# Patient Record
Sex: Male | Born: 1940 | Race: White | Hispanic: No | Marital: Married | Smoking: Former smoker
Health system: Southern US, Community
[De-identification: ages and names within clinical notes are randomized; demographics above are authoritative.]

## PROBLEM LIST (undated history)

## (undated) DIAGNOSIS — C4490 Unspecified malignant neoplasm of skin, unspecified: Secondary | ICD-10-CM

## (undated) DIAGNOSIS — W309XXA Contact with unspecified agricultural machinery, initial encounter: Secondary | ICD-10-CM

## (undated) DIAGNOSIS — J449 Chronic obstructive pulmonary disease, unspecified: Secondary | ICD-10-CM

## (undated) DIAGNOSIS — I350 Nonrheumatic aortic (valve) stenosis: Secondary | ICD-10-CM

## (undated) DIAGNOSIS — E7800 Pure hypercholesterolemia, unspecified: Secondary | ICD-10-CM

## (undated) DIAGNOSIS — M1990 Unspecified osteoarthritis, unspecified site: Secondary | ICD-10-CM

## (undated) DIAGNOSIS — E119 Type 2 diabetes mellitus without complications: Secondary | ICD-10-CM

## (undated) DIAGNOSIS — I4891 Unspecified atrial fibrillation: Secondary | ICD-10-CM

## (undated) DIAGNOSIS — E1151 Type 2 diabetes mellitus with diabetic peripheral angiopathy without gangrene: Secondary | ICD-10-CM

## (undated) DIAGNOSIS — I10 Essential (primary) hypertension: Secondary | ICD-10-CM

## (undated) DIAGNOSIS — Z8739 Personal history of other diseases of the musculoskeletal system and connective tissue: Secondary | ICD-10-CM

## (undated) DIAGNOSIS — G510 Bell's palsy: Secondary | ICD-10-CM

## (undated) DIAGNOSIS — I700 Atherosclerosis of aorta: Secondary | ICD-10-CM

## (undated) HISTORY — PX: SKIN CANCER EXCISION: SHX779

## (undated) HISTORY — PX: CARDIOVERSION: SHX1299

## (undated) HISTORY — PX: CATARACT EXTRACTION, BILATERAL: SHX1313

## (undated) HISTORY — DX: Type 2 diabetes mellitus with diabetic peripheral angiopathy without gangrene: E11.51

## (undated) HISTORY — DX: Chronic obstructive pulmonary disease, unspecified: J44.9

## (undated) HISTORY — PX: LAPAROSCOPIC CHOLECYSTECTOMY: SUR755

## (undated) HISTORY — DX: Atherosclerosis of aorta: I70.0

## (undated) HISTORY — DX: Nonrheumatic aortic (valve) stenosis: I35.0

---

## 2004-10-22 DIAGNOSIS — W309XXA Contact with unspecified agricultural machinery, initial encounter: Secondary | ICD-10-CM | POA: Insufficient documentation

## 2004-10-22 HISTORY — DX: Contact with unspecified agricultural machinery, initial encounter: W30.9XXA

## 2011-06-12 DIAGNOSIS — L97509 Non-pressure chronic ulcer of other part of unspecified foot with unspecified severity: Secondary | ICD-10-CM | POA: Diagnosis not present

## 2011-06-12 DIAGNOSIS — I10 Essential (primary) hypertension: Secondary | ICD-10-CM | POA: Diagnosis not present

## 2011-06-12 DIAGNOSIS — B070 Plantar wart: Secondary | ICD-10-CM | POA: Diagnosis not present

## 2011-06-17 DIAGNOSIS — Z79899 Other long term (current) drug therapy: Secondary | ICD-10-CM | POA: Diagnosis not present

## 2011-06-17 DIAGNOSIS — I4891 Unspecified atrial fibrillation: Secondary | ICD-10-CM | POA: Diagnosis not present

## 2011-06-18 DIAGNOSIS — H2510 Age-related nuclear cataract, unspecified eye: Secondary | ICD-10-CM | POA: Diagnosis not present

## 2011-06-25 DIAGNOSIS — E119 Type 2 diabetes mellitus without complications: Secondary | ICD-10-CM | POA: Diagnosis not present

## 2011-06-25 DIAGNOSIS — E785 Hyperlipidemia, unspecified: Secondary | ICD-10-CM | POA: Diagnosis not present

## 2011-06-25 DIAGNOSIS — I10 Essential (primary) hypertension: Secondary | ICD-10-CM | POA: Diagnosis not present

## 2011-06-25 DIAGNOSIS — I4891 Unspecified atrial fibrillation: Secondary | ICD-10-CM | POA: Diagnosis not present

## 2011-06-30 DIAGNOSIS — H2510 Age-related nuclear cataract, unspecified eye: Secondary | ICD-10-CM | POA: Diagnosis not present

## 2011-07-03 DIAGNOSIS — H2510 Age-related nuclear cataract, unspecified eye: Secondary | ICD-10-CM | POA: Diagnosis not present

## 2011-07-03 DIAGNOSIS — IMO0002 Reserved for concepts with insufficient information to code with codable children: Secondary | ICD-10-CM | POA: Diagnosis not present

## 2011-07-10 DIAGNOSIS — I10 Essential (primary) hypertension: Secondary | ICD-10-CM | POA: Diagnosis not present

## 2011-07-10 DIAGNOSIS — B070 Plantar wart: Secondary | ICD-10-CM | POA: Diagnosis not present

## 2011-07-10 DIAGNOSIS — L97509 Non-pressure chronic ulcer of other part of unspecified foot with unspecified severity: Secondary | ICD-10-CM | POA: Diagnosis not present

## 2011-07-22 DIAGNOSIS — I4891 Unspecified atrial fibrillation: Secondary | ICD-10-CM | POA: Diagnosis not present

## 2011-07-22 DIAGNOSIS — Z7901 Long term (current) use of anticoagulants: Secondary | ICD-10-CM | POA: Diagnosis not present

## 2011-07-24 DIAGNOSIS — B070 Plantar wart: Secondary | ICD-10-CM | POA: Diagnosis not present

## 2011-07-24 DIAGNOSIS — L97509 Non-pressure chronic ulcer of other part of unspecified foot with unspecified severity: Secondary | ICD-10-CM | POA: Diagnosis not present

## 2011-08-07 DIAGNOSIS — B070 Plantar wart: Secondary | ICD-10-CM | POA: Diagnosis not present

## 2011-08-07 DIAGNOSIS — L97509 Non-pressure chronic ulcer of other part of unspecified foot with unspecified severity: Secondary | ICD-10-CM | POA: Diagnosis not present

## 2011-08-07 DIAGNOSIS — I10 Essential (primary) hypertension: Secondary | ICD-10-CM | POA: Diagnosis not present

## 2011-08-18 DIAGNOSIS — Z0000 Encounter for general adult medical examination without abnormal findings: Secondary | ICD-10-CM | POA: Diagnosis not present

## 2011-08-19 DIAGNOSIS — Z7901 Long term (current) use of anticoagulants: Secondary | ICD-10-CM | POA: Diagnosis not present

## 2011-08-19 DIAGNOSIS — I4891 Unspecified atrial fibrillation: Secondary | ICD-10-CM | POA: Diagnosis not present

## 2011-08-21 DIAGNOSIS — B070 Plantar wart: Secondary | ICD-10-CM | POA: Diagnosis not present

## 2011-08-21 DIAGNOSIS — L97509 Non-pressure chronic ulcer of other part of unspecified foot with unspecified severity: Secondary | ICD-10-CM | POA: Diagnosis not present

## 2011-09-01 DIAGNOSIS — R5381 Other malaise: Secondary | ICD-10-CM | POA: Diagnosis not present

## 2011-09-01 DIAGNOSIS — E669 Obesity, unspecified: Secondary | ICD-10-CM | POA: Diagnosis not present

## 2011-09-04 DIAGNOSIS — L97509 Non-pressure chronic ulcer of other part of unspecified foot with unspecified severity: Secondary | ICD-10-CM | POA: Diagnosis not present

## 2011-09-04 DIAGNOSIS — B070 Plantar wart: Secondary | ICD-10-CM | POA: Diagnosis not present

## 2011-09-18 DIAGNOSIS — B070 Plantar wart: Secondary | ICD-10-CM | POA: Diagnosis not present

## 2011-09-18 DIAGNOSIS — L97509 Non-pressure chronic ulcer of other part of unspecified foot with unspecified severity: Secondary | ICD-10-CM | POA: Diagnosis not present

## 2011-09-18 DIAGNOSIS — I10 Essential (primary) hypertension: Secondary | ICD-10-CM | POA: Diagnosis not present

## 2011-09-30 DIAGNOSIS — E782 Mixed hyperlipidemia: Secondary | ICD-10-CM | POA: Diagnosis not present

## 2011-09-30 DIAGNOSIS — E1129 Type 2 diabetes mellitus with other diabetic kidney complication: Secondary | ICD-10-CM | POA: Diagnosis not present

## 2011-09-30 DIAGNOSIS — I10 Essential (primary) hypertension: Secondary | ICD-10-CM | POA: Diagnosis not present

## 2011-09-30 DIAGNOSIS — I4891 Unspecified atrial fibrillation: Secondary | ICD-10-CM | POA: Diagnosis not present

## 2011-10-02 DIAGNOSIS — M79609 Pain in unspecified limb: Secondary | ICD-10-CM | POA: Diagnosis not present

## 2011-10-16 DIAGNOSIS — L97509 Non-pressure chronic ulcer of other part of unspecified foot with unspecified severity: Secondary | ICD-10-CM | POA: Diagnosis not present

## 2011-10-30 DIAGNOSIS — S91109A Unspecified open wound of unspecified toe(s) without damage to nail, initial encounter: Secondary | ICD-10-CM | POA: Diagnosis not present

## 2011-10-30 DIAGNOSIS — I4891 Unspecified atrial fibrillation: Secondary | ICD-10-CM | POA: Diagnosis not present

## 2011-12-01 DIAGNOSIS — I4891 Unspecified atrial fibrillation: Secondary | ICD-10-CM | POA: Diagnosis not present

## 2011-12-30 DIAGNOSIS — I4891 Unspecified atrial fibrillation: Secondary | ICD-10-CM | POA: Diagnosis not present

## 2012-01-29 DIAGNOSIS — I4891 Unspecified atrial fibrillation: Secondary | ICD-10-CM | POA: Diagnosis not present

## 2012-02-05 DIAGNOSIS — E782 Mixed hyperlipidemia: Secondary | ICD-10-CM | POA: Diagnosis not present

## 2012-02-05 DIAGNOSIS — E1129 Type 2 diabetes mellitus with other diabetic kidney complication: Secondary | ICD-10-CM | POA: Diagnosis not present

## 2012-02-05 DIAGNOSIS — I10 Essential (primary) hypertension: Secondary | ICD-10-CM | POA: Diagnosis not present

## 2012-02-05 DIAGNOSIS — E1165 Type 2 diabetes mellitus with hyperglycemia: Secondary | ICD-10-CM | POA: Diagnosis not present

## 2012-02-19 DIAGNOSIS — I10 Essential (primary) hypertension: Secondary | ICD-10-CM | POA: Diagnosis not present

## 2012-02-19 DIAGNOSIS — E785 Hyperlipidemia, unspecified: Secondary | ICD-10-CM | POA: Diagnosis not present

## 2012-02-19 DIAGNOSIS — I4891 Unspecified atrial fibrillation: Secondary | ICD-10-CM | POA: Diagnosis not present

## 2012-02-19 DIAGNOSIS — E119 Type 2 diabetes mellitus without complications: Secondary | ICD-10-CM | POA: Diagnosis not present

## 2012-02-23 DIAGNOSIS — I4891 Unspecified atrial fibrillation: Secondary | ICD-10-CM | POA: Diagnosis not present

## 2012-03-04 DIAGNOSIS — I4891 Unspecified atrial fibrillation: Secondary | ICD-10-CM | POA: Diagnosis not present

## 2012-03-04 DIAGNOSIS — E782 Mixed hyperlipidemia: Secondary | ICD-10-CM | POA: Diagnosis not present

## 2012-04-02 DIAGNOSIS — Z7901 Long term (current) use of anticoagulants: Secondary | ICD-10-CM | POA: Diagnosis not present

## 2012-04-02 DIAGNOSIS — I4891 Unspecified atrial fibrillation: Secondary | ICD-10-CM | POA: Diagnosis not present

## 2012-04-28 DIAGNOSIS — I4891 Unspecified atrial fibrillation: Secondary | ICD-10-CM | POA: Diagnosis not present

## 2012-04-28 DIAGNOSIS — Z7901 Long term (current) use of anticoagulants: Secondary | ICD-10-CM | POA: Diagnosis not present

## 2012-06-11 DIAGNOSIS — E782 Mixed hyperlipidemia: Secondary | ICD-10-CM | POA: Diagnosis not present

## 2012-06-11 DIAGNOSIS — E1165 Type 2 diabetes mellitus with hyperglycemia: Secondary | ICD-10-CM | POA: Diagnosis not present

## 2012-06-11 DIAGNOSIS — E1129 Type 2 diabetes mellitus with other diabetic kidney complication: Secondary | ICD-10-CM | POA: Diagnosis not present

## 2012-06-11 DIAGNOSIS — Z7901 Long term (current) use of anticoagulants: Secondary | ICD-10-CM | POA: Diagnosis not present

## 2012-06-11 DIAGNOSIS — I10 Essential (primary) hypertension: Secondary | ICD-10-CM | POA: Diagnosis not present

## 2012-06-11 DIAGNOSIS — I4891 Unspecified atrial fibrillation: Secondary | ICD-10-CM | POA: Diagnosis not present

## 2012-07-29 DIAGNOSIS — Z7901 Long term (current) use of anticoagulants: Secondary | ICD-10-CM | POA: Diagnosis not present

## 2012-07-29 DIAGNOSIS — I4891 Unspecified atrial fibrillation: Secondary | ICD-10-CM | POA: Diagnosis not present

## 2012-08-23 DIAGNOSIS — E119 Type 2 diabetes mellitus without complications: Secondary | ICD-10-CM | POA: Diagnosis not present

## 2012-08-23 DIAGNOSIS — I10 Essential (primary) hypertension: Secondary | ICD-10-CM | POA: Diagnosis not present

## 2012-08-23 DIAGNOSIS — E669 Obesity, unspecified: Secondary | ICD-10-CM | POA: Diagnosis not present

## 2012-08-23 DIAGNOSIS — E785 Hyperlipidemia, unspecified: Secondary | ICD-10-CM | POA: Diagnosis not present

## 2012-08-23 DIAGNOSIS — I4891 Unspecified atrial fibrillation: Secondary | ICD-10-CM | POA: Diagnosis not present

## 2012-09-06 DIAGNOSIS — K529 Noninfective gastroenteritis and colitis, unspecified: Secondary | ICD-10-CM | POA: Diagnosis not present

## 2012-09-06 DIAGNOSIS — I4891 Unspecified atrial fibrillation: Secondary | ICD-10-CM | POA: Diagnosis not present

## 2012-10-12 DIAGNOSIS — Z1331 Encounter for screening for depression: Secondary | ICD-10-CM | POA: Diagnosis not present

## 2012-10-12 DIAGNOSIS — I4891 Unspecified atrial fibrillation: Secondary | ICD-10-CM | POA: Diagnosis not present

## 2012-10-14 DIAGNOSIS — I10 Essential (primary) hypertension: Secondary | ICD-10-CM | POA: Diagnosis not present

## 2012-10-14 DIAGNOSIS — E1165 Type 2 diabetes mellitus with hyperglycemia: Secondary | ICD-10-CM | POA: Diagnosis not present

## 2012-10-14 DIAGNOSIS — E782 Mixed hyperlipidemia: Secondary | ICD-10-CM | POA: Diagnosis not present

## 2012-10-14 DIAGNOSIS — E1129 Type 2 diabetes mellitus with other diabetic kidney complication: Secondary | ICD-10-CM | POA: Diagnosis not present

## 2012-11-11 DIAGNOSIS — I4891 Unspecified atrial fibrillation: Secondary | ICD-10-CM | POA: Diagnosis not present

## 2012-12-13 DIAGNOSIS — I4891 Unspecified atrial fibrillation: Secondary | ICD-10-CM | POA: Diagnosis not present

## 2013-01-13 DIAGNOSIS — I4891 Unspecified atrial fibrillation: Secondary | ICD-10-CM | POA: Diagnosis not present

## 2013-02-23 DIAGNOSIS — E1129 Type 2 diabetes mellitus with other diabetic kidney complication: Secondary | ICD-10-CM | POA: Diagnosis not present

## 2013-02-23 DIAGNOSIS — E782 Mixed hyperlipidemia: Secondary | ICD-10-CM | POA: Diagnosis not present

## 2013-02-23 DIAGNOSIS — N058 Unspecified nephritic syndrome with other morphologic changes: Secondary | ICD-10-CM | POA: Diagnosis not present

## 2013-02-23 DIAGNOSIS — I4891 Unspecified atrial fibrillation: Secondary | ICD-10-CM | POA: Diagnosis not present

## 2013-02-23 DIAGNOSIS — I10 Essential (primary) hypertension: Secondary | ICD-10-CM | POA: Diagnosis not present

## 2013-03-24 DIAGNOSIS — I4891 Unspecified atrial fibrillation: Secondary | ICD-10-CM | POA: Diagnosis not present

## 2013-04-19 DIAGNOSIS — I4891 Unspecified atrial fibrillation: Secondary | ICD-10-CM | POA: Diagnosis not present

## 2013-05-26 DIAGNOSIS — I4891 Unspecified atrial fibrillation: Secondary | ICD-10-CM | POA: Diagnosis not present

## 2013-07-13 DIAGNOSIS — Z1389 Encounter for screening for other disorder: Secondary | ICD-10-CM | POA: Diagnosis not present

## 2013-07-13 DIAGNOSIS — I10 Essential (primary) hypertension: Secondary | ICD-10-CM | POA: Diagnosis not present

## 2013-07-13 DIAGNOSIS — E1129 Type 2 diabetes mellitus with other diabetic kidney complication: Secondary | ICD-10-CM | POA: Diagnosis not present

## 2013-07-13 DIAGNOSIS — E782 Mixed hyperlipidemia: Secondary | ICD-10-CM | POA: Diagnosis not present

## 2013-08-11 DIAGNOSIS — I4891 Unspecified atrial fibrillation: Secondary | ICD-10-CM | POA: Diagnosis not present

## 2013-08-11 DIAGNOSIS — Z7901 Long term (current) use of anticoagulants: Secondary | ICD-10-CM | POA: Diagnosis not present

## 2013-09-12 DIAGNOSIS — I4891 Unspecified atrial fibrillation: Secondary | ICD-10-CM | POA: Diagnosis not present

## 2013-09-12 DIAGNOSIS — Z7901 Long term (current) use of anticoagulants: Secondary | ICD-10-CM | POA: Diagnosis not present

## 2013-10-10 DIAGNOSIS — Z7901 Long term (current) use of anticoagulants: Secondary | ICD-10-CM | POA: Diagnosis not present

## 2013-10-10 DIAGNOSIS — I4891 Unspecified atrial fibrillation: Secondary | ICD-10-CM | POA: Diagnosis not present

## 2013-11-04 DIAGNOSIS — I10 Essential (primary) hypertension: Secondary | ICD-10-CM | POA: Diagnosis not present

## 2013-11-04 DIAGNOSIS — E1129 Type 2 diabetes mellitus with other diabetic kidney complication: Secondary | ICD-10-CM | POA: Diagnosis not present

## 2013-11-04 DIAGNOSIS — E782 Mixed hyperlipidemia: Secondary | ICD-10-CM | POA: Diagnosis not present

## 2013-11-10 DIAGNOSIS — E1149 Type 2 diabetes mellitus with other diabetic neurological complication: Secondary | ICD-10-CM | POA: Diagnosis not present

## 2013-11-10 DIAGNOSIS — I4891 Unspecified atrial fibrillation: Secondary | ICD-10-CM | POA: Diagnosis not present

## 2013-11-10 DIAGNOSIS — E782 Mixed hyperlipidemia: Secondary | ICD-10-CM | POA: Diagnosis not present

## 2013-11-10 DIAGNOSIS — Z139 Encounter for screening, unspecified: Secondary | ICD-10-CM | POA: Diagnosis not present

## 2013-11-10 DIAGNOSIS — I10 Essential (primary) hypertension: Secondary | ICD-10-CM | POA: Diagnosis not present

## 2013-11-10 DIAGNOSIS — E1142 Type 2 diabetes mellitus with diabetic polyneuropathy: Secondary | ICD-10-CM | POA: Diagnosis not present

## 2013-11-10 DIAGNOSIS — Z7901 Long term (current) use of anticoagulants: Secondary | ICD-10-CM | POA: Diagnosis not present

## 2013-12-13 DIAGNOSIS — I4891 Unspecified atrial fibrillation: Secondary | ICD-10-CM | POA: Diagnosis not present

## 2014-01-16 DIAGNOSIS — M5030 Other cervical disc degeneration, unspecified cervical region: Secondary | ICD-10-CM | POA: Diagnosis not present

## 2014-01-16 DIAGNOSIS — M542 Cervicalgia: Secondary | ICD-10-CM | POA: Diagnosis not present

## 2014-01-16 DIAGNOSIS — M47812 Spondylosis without myelopathy or radiculopathy, cervical region: Secondary | ICD-10-CM | POA: Diagnosis not present

## 2014-01-16 DIAGNOSIS — I4891 Unspecified atrial fibrillation: Secondary | ICD-10-CM | POA: Diagnosis not present

## 2014-01-16 DIAGNOSIS — Z6836 Body mass index (BMI) 36.0-36.9, adult: Secondary | ICD-10-CM | POA: Diagnosis not present

## 2014-01-18 DIAGNOSIS — I4891 Unspecified atrial fibrillation: Secondary | ICD-10-CM | POA: Diagnosis not present

## 2014-01-18 DIAGNOSIS — Z7901 Long term (current) use of anticoagulants: Secondary | ICD-10-CM | POA: Diagnosis not present

## 2014-01-18 DIAGNOSIS — E785 Hyperlipidemia, unspecified: Secondary | ICD-10-CM | POA: Diagnosis not present

## 2014-01-18 DIAGNOSIS — I059 Rheumatic mitral valve disease, unspecified: Secondary | ICD-10-CM | POA: Diagnosis not present

## 2014-01-18 DIAGNOSIS — Z79899 Other long term (current) drug therapy: Secondary | ICD-10-CM | POA: Diagnosis not present

## 2014-01-18 DIAGNOSIS — I10 Essential (primary) hypertension: Secondary | ICD-10-CM | POA: Diagnosis not present

## 2014-01-18 DIAGNOSIS — E119 Type 2 diabetes mellitus without complications: Secondary | ICD-10-CM | POA: Diagnosis not present

## 2014-01-23 DIAGNOSIS — I059 Rheumatic mitral valve disease, unspecified: Secondary | ICD-10-CM | POA: Diagnosis not present

## 2014-01-23 DIAGNOSIS — I4891 Unspecified atrial fibrillation: Secondary | ICD-10-CM | POA: Diagnosis not present

## 2014-01-23 DIAGNOSIS — I119 Hypertensive heart disease without heart failure: Secondary | ICD-10-CM | POA: Diagnosis not present

## 2014-01-24 DIAGNOSIS — I4891 Unspecified atrial fibrillation: Secondary | ICD-10-CM | POA: Diagnosis not present

## 2014-01-30 DIAGNOSIS — Z6835 Body mass index (BMI) 35.0-35.9, adult: Secondary | ICD-10-CM | POA: Diagnosis not present

## 2014-01-30 DIAGNOSIS — M542 Cervicalgia: Secondary | ICD-10-CM | POA: Diagnosis not present

## 2014-02-15 DIAGNOSIS — I10 Essential (primary) hypertension: Secondary | ICD-10-CM | POA: Diagnosis not present

## 2014-02-15 DIAGNOSIS — I059 Rheumatic mitral valve disease, unspecified: Secondary | ICD-10-CM | POA: Diagnosis not present

## 2014-02-15 DIAGNOSIS — I4891 Unspecified atrial fibrillation: Secondary | ICD-10-CM | POA: Diagnosis not present

## 2014-02-15 DIAGNOSIS — Z79899 Other long term (current) drug therapy: Secondary | ICD-10-CM | POA: Diagnosis not present

## 2014-02-15 DIAGNOSIS — E119 Type 2 diabetes mellitus without complications: Secondary | ICD-10-CM | POA: Diagnosis not present

## 2014-02-15 DIAGNOSIS — E785 Hyperlipidemia, unspecified: Secondary | ICD-10-CM | POA: Diagnosis not present

## 2014-02-16 DIAGNOSIS — I4891 Unspecified atrial fibrillation: Secondary | ICD-10-CM | POA: Diagnosis not present

## 2014-03-14 DIAGNOSIS — E782 Mixed hyperlipidemia: Secondary | ICD-10-CM | POA: Diagnosis not present

## 2014-03-14 DIAGNOSIS — I10 Essential (primary) hypertension: Secondary | ICD-10-CM | POA: Diagnosis not present

## 2014-03-14 DIAGNOSIS — E1140 Type 2 diabetes mellitus with diabetic neuropathy, unspecified: Secondary | ICD-10-CM | POA: Diagnosis not present

## 2014-03-16 DIAGNOSIS — E1140 Type 2 diabetes mellitus with diabetic neuropathy, unspecified: Secondary | ICD-10-CM | POA: Diagnosis not present

## 2014-03-16 DIAGNOSIS — Z1389 Encounter for screening for other disorder: Secondary | ICD-10-CM | POA: Diagnosis not present

## 2014-03-16 DIAGNOSIS — I482 Chronic atrial fibrillation: Secondary | ICD-10-CM | POA: Diagnosis not present

## 2014-03-16 DIAGNOSIS — Z0000 Encounter for general adult medical examination without abnormal findings: Secondary | ICD-10-CM | POA: Diagnosis not present

## 2014-03-16 DIAGNOSIS — Z139 Encounter for screening, unspecified: Secondary | ICD-10-CM | POA: Diagnosis not present

## 2014-03-30 DIAGNOSIS — I482 Chronic atrial fibrillation: Secondary | ICD-10-CM | POA: Diagnosis not present

## 2014-05-01 DIAGNOSIS — I482 Chronic atrial fibrillation: Secondary | ICD-10-CM | POA: Diagnosis not present

## 2014-05-31 DIAGNOSIS — I482 Chronic atrial fibrillation: Secondary | ICD-10-CM | POA: Diagnosis not present

## 2014-07-04 DIAGNOSIS — E1140 Type 2 diabetes mellitus with diabetic neuropathy, unspecified: Secondary | ICD-10-CM | POA: Diagnosis not present

## 2014-07-04 DIAGNOSIS — E782 Mixed hyperlipidemia: Secondary | ICD-10-CM | POA: Diagnosis not present

## 2014-07-10 DIAGNOSIS — I10 Essential (primary) hypertension: Secondary | ICD-10-CM | POA: Diagnosis not present

## 2014-07-10 DIAGNOSIS — E782 Mixed hyperlipidemia: Secondary | ICD-10-CM | POA: Diagnosis not present

## 2014-07-10 DIAGNOSIS — I4891 Unspecified atrial fibrillation: Secondary | ICD-10-CM | POA: Diagnosis not present

## 2014-07-10 DIAGNOSIS — E1140 Type 2 diabetes mellitus with diabetic neuropathy, unspecified: Secondary | ICD-10-CM | POA: Diagnosis not present

## 2014-08-08 DIAGNOSIS — I4891 Unspecified atrial fibrillation: Secondary | ICD-10-CM | POA: Diagnosis not present

## 2014-09-12 DIAGNOSIS — I482 Chronic atrial fibrillation: Secondary | ICD-10-CM | POA: Diagnosis not present

## 2014-10-11 DIAGNOSIS — I482 Chronic atrial fibrillation: Secondary | ICD-10-CM | POA: Diagnosis not present

## 2014-11-07 DIAGNOSIS — E782 Mixed hyperlipidemia: Secondary | ICD-10-CM | POA: Diagnosis not present

## 2014-11-07 DIAGNOSIS — I10 Essential (primary) hypertension: Secondary | ICD-10-CM | POA: Diagnosis not present

## 2014-11-07 DIAGNOSIS — E1140 Type 2 diabetes mellitus with diabetic neuropathy, unspecified: Secondary | ICD-10-CM | POA: Diagnosis not present

## 2014-11-13 DIAGNOSIS — E1140 Type 2 diabetes mellitus with diabetic neuropathy, unspecified: Secondary | ICD-10-CM | POA: Diagnosis not present

## 2014-11-13 DIAGNOSIS — I10 Essential (primary) hypertension: Secondary | ICD-10-CM | POA: Diagnosis not present

## 2014-11-13 DIAGNOSIS — E1159 Type 2 diabetes mellitus with other circulatory complications: Secondary | ICD-10-CM | POA: Diagnosis not present

## 2014-11-13 DIAGNOSIS — I482 Chronic atrial fibrillation: Secondary | ICD-10-CM | POA: Diagnosis not present

## 2014-12-18 DIAGNOSIS — I482 Chronic atrial fibrillation: Secondary | ICD-10-CM | POA: Diagnosis not present

## 2015-01-18 DIAGNOSIS — I482 Chronic atrial fibrillation: Secondary | ICD-10-CM | POA: Diagnosis not present

## 2015-02-19 DIAGNOSIS — I482 Chronic atrial fibrillation: Secondary | ICD-10-CM | POA: Diagnosis not present

## 2015-03-08 DIAGNOSIS — E1140 Type 2 diabetes mellitus with diabetic neuropathy, unspecified: Secondary | ICD-10-CM | POA: Diagnosis not present

## 2015-03-08 DIAGNOSIS — E1159 Type 2 diabetes mellitus with other circulatory complications: Secondary | ICD-10-CM | POA: Diagnosis not present

## 2015-03-08 DIAGNOSIS — E782 Mixed hyperlipidemia: Secondary | ICD-10-CM | POA: Diagnosis not present

## 2015-03-12 DIAGNOSIS — R809 Proteinuria, unspecified: Secondary | ICD-10-CM | POA: Diagnosis not present

## 2015-03-12 DIAGNOSIS — E1129 Type 2 diabetes mellitus with other diabetic kidney complication: Secondary | ICD-10-CM | POA: Diagnosis not present

## 2015-03-12 DIAGNOSIS — E1159 Type 2 diabetes mellitus with other circulatory complications: Secondary | ICD-10-CM | POA: Diagnosis not present

## 2015-03-12 DIAGNOSIS — E782 Mixed hyperlipidemia: Secondary | ICD-10-CM | POA: Diagnosis not present

## 2015-03-12 DIAGNOSIS — E1140 Type 2 diabetes mellitus with diabetic neuropathy, unspecified: Secondary | ICD-10-CM | POA: Diagnosis not present

## 2015-03-12 DIAGNOSIS — I10 Essential (primary) hypertension: Secondary | ICD-10-CM | POA: Diagnosis not present

## 2015-04-03 DIAGNOSIS — Z1389 Encounter for screening for other disorder: Secondary | ICD-10-CM | POA: Diagnosis not present

## 2015-04-03 DIAGNOSIS — Z139 Encounter for screening, unspecified: Secondary | ICD-10-CM | POA: Diagnosis not present

## 2015-04-03 DIAGNOSIS — I482 Chronic atrial fibrillation: Secondary | ICD-10-CM | POA: Diagnosis not present

## 2015-04-03 DIAGNOSIS — Z0000 Encounter for general adult medical examination without abnormal findings: Secondary | ICD-10-CM | POA: Diagnosis not present

## 2015-04-18 DIAGNOSIS — I482 Chronic atrial fibrillation: Secondary | ICD-10-CM | POA: Diagnosis not present

## 2015-05-17 DIAGNOSIS — I482 Chronic atrial fibrillation: Secondary | ICD-10-CM | POA: Diagnosis not present

## 2015-06-19 DIAGNOSIS — I482 Chronic atrial fibrillation: Secondary | ICD-10-CM | POA: Diagnosis not present

## 2015-07-10 DIAGNOSIS — E782 Mixed hyperlipidemia: Secondary | ICD-10-CM | POA: Diagnosis not present

## 2015-07-10 DIAGNOSIS — E1159 Type 2 diabetes mellitus with other circulatory complications: Secondary | ICD-10-CM | POA: Diagnosis not present

## 2015-07-10 DIAGNOSIS — E1140 Type 2 diabetes mellitus with diabetic neuropathy, unspecified: Secondary | ICD-10-CM | POA: Diagnosis not present

## 2015-07-10 DIAGNOSIS — I482 Chronic atrial fibrillation: Secondary | ICD-10-CM | POA: Diagnosis not present

## 2015-07-10 DIAGNOSIS — Z125 Encounter for screening for malignant neoplasm of prostate: Secondary | ICD-10-CM | POA: Diagnosis not present

## 2015-07-16 DIAGNOSIS — I10 Essential (primary) hypertension: Secondary | ICD-10-CM | POA: Diagnosis not present

## 2015-07-16 DIAGNOSIS — E782 Mixed hyperlipidemia: Secondary | ICD-10-CM | POA: Diagnosis not present

## 2015-07-16 DIAGNOSIS — E1159 Type 2 diabetes mellitus with other circulatory complications: Secondary | ICD-10-CM | POA: Diagnosis not present

## 2015-07-16 DIAGNOSIS — E1140 Type 2 diabetes mellitus with diabetic neuropathy, unspecified: Secondary | ICD-10-CM | POA: Diagnosis not present

## 2015-08-07 DIAGNOSIS — I482 Chronic atrial fibrillation: Secondary | ICD-10-CM | POA: Diagnosis not present

## 2015-08-21 DIAGNOSIS — I482 Chronic atrial fibrillation: Secondary | ICD-10-CM | POA: Diagnosis not present

## 2015-09-19 DIAGNOSIS — I482 Chronic atrial fibrillation: Secondary | ICD-10-CM | POA: Diagnosis not present

## 2015-11-07 DIAGNOSIS — E1159 Type 2 diabetes mellitus with other circulatory complications: Secondary | ICD-10-CM | POA: Diagnosis not present

## 2015-11-07 DIAGNOSIS — E1140 Type 2 diabetes mellitus with diabetic neuropathy, unspecified: Secondary | ICD-10-CM | POA: Diagnosis not present

## 2015-11-07 DIAGNOSIS — E782 Mixed hyperlipidemia: Secondary | ICD-10-CM | POA: Diagnosis not present

## 2015-11-13 DIAGNOSIS — E1140 Type 2 diabetes mellitus with diabetic neuropathy, unspecified: Secondary | ICD-10-CM | POA: Diagnosis not present

## 2015-11-13 DIAGNOSIS — I10 Essential (primary) hypertension: Secondary | ICD-10-CM | POA: Diagnosis not present

## 2015-11-13 DIAGNOSIS — E782 Mixed hyperlipidemia: Secondary | ICD-10-CM | POA: Diagnosis not present

## 2015-11-13 DIAGNOSIS — E1159 Type 2 diabetes mellitus with other circulatory complications: Secondary | ICD-10-CM | POA: Diagnosis not present

## 2015-11-27 DIAGNOSIS — Z6830 Body mass index (BMI) 30.0-30.9, adult: Secondary | ICD-10-CM | POA: Diagnosis not present

## 2015-11-27 DIAGNOSIS — I4891 Unspecified atrial fibrillation: Secondary | ICD-10-CM | POA: Diagnosis not present

## 2015-12-27 DIAGNOSIS — I4891 Unspecified atrial fibrillation: Secondary | ICD-10-CM | POA: Diagnosis not present

## 2015-12-27 DIAGNOSIS — Z6835 Body mass index (BMI) 35.0-35.9, adult: Secondary | ICD-10-CM | POA: Diagnosis not present

## 2016-01-14 DIAGNOSIS — E119 Type 2 diabetes mellitus without complications: Secondary | ICD-10-CM | POA: Diagnosis not present

## 2016-01-14 DIAGNOSIS — H25812 Combined forms of age-related cataract, left eye: Secondary | ICD-10-CM | POA: Diagnosis not present

## 2016-01-28 DIAGNOSIS — I4891 Unspecified atrial fibrillation: Secondary | ICD-10-CM | POA: Diagnosis not present

## 2016-01-28 DIAGNOSIS — Z6830 Body mass index (BMI) 30.0-30.9, adult: Secondary | ICD-10-CM | POA: Diagnosis not present

## 2016-02-18 DIAGNOSIS — I482 Chronic atrial fibrillation: Secondary | ICD-10-CM | POA: Diagnosis not present

## 2016-02-18 DIAGNOSIS — Z6835 Body mass index (BMI) 35.0-35.9, adult: Secondary | ICD-10-CM | POA: Diagnosis not present

## 2016-03-06 DIAGNOSIS — E1140 Type 2 diabetes mellitus with diabetic neuropathy, unspecified: Secondary | ICD-10-CM | POA: Diagnosis not present

## 2016-03-06 DIAGNOSIS — E1159 Type 2 diabetes mellitus with other circulatory complications: Secondary | ICD-10-CM | POA: Diagnosis not present

## 2016-03-06 DIAGNOSIS — E782 Mixed hyperlipidemia: Secondary | ICD-10-CM | POA: Diagnosis not present

## 2016-03-07 DIAGNOSIS — H26491 Other secondary cataract, right eye: Secondary | ICD-10-CM | POA: Diagnosis not present

## 2016-03-07 DIAGNOSIS — E119 Type 2 diabetes mellitus without complications: Secondary | ICD-10-CM | POA: Diagnosis not present

## 2016-03-07 DIAGNOSIS — H25812 Combined forms of age-related cataract, left eye: Secondary | ICD-10-CM | POA: Diagnosis not present

## 2016-03-11 DIAGNOSIS — I482 Chronic atrial fibrillation: Secondary | ICD-10-CM | POA: Diagnosis not present

## 2016-03-11 DIAGNOSIS — E1169 Type 2 diabetes mellitus with other specified complication: Secondary | ICD-10-CM | POA: Diagnosis not present

## 2016-03-11 DIAGNOSIS — E1140 Type 2 diabetes mellitus with diabetic neuropathy, unspecified: Secondary | ICD-10-CM | POA: Diagnosis not present

## 2016-03-11 DIAGNOSIS — I10 Essential (primary) hypertension: Secondary | ICD-10-CM | POA: Diagnosis not present

## 2016-03-26 DIAGNOSIS — M1990 Unspecified osteoarthritis, unspecified site: Secondary | ICD-10-CM | POA: Diagnosis not present

## 2016-03-26 DIAGNOSIS — E119 Type 2 diabetes mellitus without complications: Secondary | ICD-10-CM | POA: Diagnosis not present

## 2016-03-26 DIAGNOSIS — H25812 Combined forms of age-related cataract, left eye: Secondary | ICD-10-CM | POA: Diagnosis not present

## 2016-03-26 DIAGNOSIS — Z85828 Personal history of other malignant neoplasm of skin: Secondary | ICD-10-CM | POA: Diagnosis not present

## 2016-03-26 DIAGNOSIS — E785 Hyperlipidemia, unspecified: Secondary | ICD-10-CM | POA: Diagnosis not present

## 2016-03-26 DIAGNOSIS — Z7984 Long term (current) use of oral hypoglycemic drugs: Secondary | ICD-10-CM | POA: Diagnosis not present

## 2016-03-26 DIAGNOSIS — I10 Essential (primary) hypertension: Secondary | ICD-10-CM | POA: Diagnosis not present

## 2016-03-26 DIAGNOSIS — Z7901 Long term (current) use of anticoagulants: Secondary | ICD-10-CM | POA: Diagnosis not present

## 2016-03-26 DIAGNOSIS — M109 Gout, unspecified: Secondary | ICD-10-CM | POA: Diagnosis not present

## 2016-03-26 DIAGNOSIS — Z79899 Other long term (current) drug therapy: Secondary | ICD-10-CM | POA: Diagnosis not present

## 2016-03-26 DIAGNOSIS — I4891 Unspecified atrial fibrillation: Secondary | ICD-10-CM | POA: Diagnosis not present

## 2016-04-07 DIAGNOSIS — H25812 Combined forms of age-related cataract, left eye: Secondary | ICD-10-CM | POA: Diagnosis not present

## 2016-04-16 DIAGNOSIS — Z0000 Encounter for general adult medical examination without abnormal findings: Secondary | ICD-10-CM | POA: Diagnosis not present

## 2016-04-16 DIAGNOSIS — Z139 Encounter for screening, unspecified: Secondary | ICD-10-CM | POA: Diagnosis not present

## 2016-04-16 DIAGNOSIS — E1140 Type 2 diabetes mellitus with diabetic neuropathy, unspecified: Secondary | ICD-10-CM | POA: Diagnosis not present

## 2016-04-16 DIAGNOSIS — I482 Chronic atrial fibrillation: Secondary | ICD-10-CM | POA: Diagnosis not present

## 2016-05-16 DIAGNOSIS — Z6831 Body mass index (BMI) 31.0-31.9, adult: Secondary | ICD-10-CM | POA: Diagnosis not present

## 2016-05-16 DIAGNOSIS — I482 Chronic atrial fibrillation: Secondary | ICD-10-CM | POA: Diagnosis not present

## 2016-06-16 DIAGNOSIS — Z6830 Body mass index (BMI) 30.0-30.9, adult: Secondary | ICD-10-CM | POA: Diagnosis not present

## 2016-06-16 DIAGNOSIS — I482 Chronic atrial fibrillation: Secondary | ICD-10-CM | POA: Diagnosis not present

## 2016-07-14 DIAGNOSIS — I482 Chronic atrial fibrillation: Secondary | ICD-10-CM | POA: Diagnosis not present

## 2016-07-18 DIAGNOSIS — E1169 Type 2 diabetes mellitus with other specified complication: Secondary | ICD-10-CM | POA: Diagnosis not present

## 2016-07-18 DIAGNOSIS — E1140 Type 2 diabetes mellitus with diabetic neuropathy, unspecified: Secondary | ICD-10-CM | POA: Diagnosis not present

## 2016-07-18 DIAGNOSIS — I10 Essential (primary) hypertension: Secondary | ICD-10-CM | POA: Diagnosis not present

## 2016-07-30 DIAGNOSIS — E1169 Type 2 diabetes mellitus with other specified complication: Secondary | ICD-10-CM | POA: Diagnosis not present

## 2016-07-30 DIAGNOSIS — I10 Essential (primary) hypertension: Secondary | ICD-10-CM | POA: Diagnosis not present

## 2016-07-30 DIAGNOSIS — E785 Hyperlipidemia, unspecified: Secondary | ICD-10-CM | POA: Diagnosis not present

## 2016-07-30 DIAGNOSIS — E1140 Type 2 diabetes mellitus with diabetic neuropathy, unspecified: Secondary | ICD-10-CM | POA: Diagnosis not present

## 2016-08-11 DIAGNOSIS — Z7901 Long term (current) use of anticoagulants: Secondary | ICD-10-CM | POA: Diagnosis not present

## 2016-08-11 DIAGNOSIS — I4891 Unspecified atrial fibrillation: Secondary | ICD-10-CM | POA: Diagnosis not present

## 2016-09-10 DIAGNOSIS — Z7901 Long term (current) use of anticoagulants: Secondary | ICD-10-CM | POA: Diagnosis not present

## 2016-09-10 DIAGNOSIS — Z6825 Body mass index (BMI) 25.0-25.9, adult: Secondary | ICD-10-CM | POA: Diagnosis not present

## 2016-09-10 DIAGNOSIS — I4891 Unspecified atrial fibrillation: Secondary | ICD-10-CM | POA: Diagnosis not present

## 2016-10-09 DIAGNOSIS — Z7901 Long term (current) use of anticoagulants: Secondary | ICD-10-CM | POA: Diagnosis not present

## 2016-10-09 DIAGNOSIS — I4891 Unspecified atrial fibrillation: Secondary | ICD-10-CM | POA: Diagnosis not present

## 2016-10-09 DIAGNOSIS — Z6830 Body mass index (BMI) 30.0-30.9, adult: Secondary | ICD-10-CM | POA: Diagnosis not present

## 2016-10-09 DIAGNOSIS — Z5181 Encounter for therapeutic drug level monitoring: Secondary | ICD-10-CM | POA: Diagnosis not present

## 2016-10-13 DIAGNOSIS — E1169 Type 2 diabetes mellitus with other specified complication: Secondary | ICD-10-CM | POA: Diagnosis not present

## 2016-10-13 DIAGNOSIS — I10 Essential (primary) hypertension: Secondary | ICD-10-CM | POA: Diagnosis not present

## 2016-10-13 DIAGNOSIS — E1140 Type 2 diabetes mellitus with diabetic neuropathy, unspecified: Secondary | ICD-10-CM | POA: Diagnosis not present

## 2016-10-13 DIAGNOSIS — Z125 Encounter for screening for malignant neoplasm of prostate: Secondary | ICD-10-CM | POA: Diagnosis not present

## 2016-10-27 DIAGNOSIS — I10 Essential (primary) hypertension: Secondary | ICD-10-CM | POA: Diagnosis not present

## 2016-10-27 DIAGNOSIS — E1140 Type 2 diabetes mellitus with diabetic neuropathy, unspecified: Secondary | ICD-10-CM | POA: Diagnosis not present

## 2016-10-27 DIAGNOSIS — E785 Hyperlipidemia, unspecified: Secondary | ICD-10-CM | POA: Diagnosis not present

## 2016-10-27 DIAGNOSIS — E1169 Type 2 diabetes mellitus with other specified complication: Secondary | ICD-10-CM | POA: Diagnosis not present

## 2016-11-11 DIAGNOSIS — L299 Pruritus, unspecified: Secondary | ICD-10-CM | POA: Diagnosis not present

## 2016-11-11 DIAGNOSIS — Z7901 Long term (current) use of anticoagulants: Secondary | ICD-10-CM | POA: Diagnosis not present

## 2016-11-11 DIAGNOSIS — Z5181 Encounter for therapeutic drug level monitoring: Secondary | ICD-10-CM | POA: Diagnosis not present

## 2016-11-11 DIAGNOSIS — I482 Chronic atrial fibrillation: Secondary | ICD-10-CM | POA: Diagnosis not present

## 2016-12-22 DIAGNOSIS — Z5181 Encounter for therapeutic drug level monitoring: Secondary | ICD-10-CM | POA: Diagnosis not present

## 2016-12-22 DIAGNOSIS — I482 Chronic atrial fibrillation: Secondary | ICD-10-CM | POA: Diagnosis not present

## 2016-12-22 DIAGNOSIS — Z6831 Body mass index (BMI) 31.0-31.9, adult: Secondary | ICD-10-CM | POA: Diagnosis not present

## 2016-12-22 DIAGNOSIS — Z7901 Long term (current) use of anticoagulants: Secondary | ICD-10-CM | POA: Diagnosis not present

## 2016-12-30 ENCOUNTER — Other Ambulatory Visit: Payer: Self-pay

## 2017-01-26 DIAGNOSIS — I10 Essential (primary) hypertension: Secondary | ICD-10-CM | POA: Diagnosis not present

## 2017-01-26 DIAGNOSIS — F3160 Bipolar disorder, current episode mixed, unspecified: Secondary | ICD-10-CM | POA: Diagnosis not present

## 2017-01-26 DIAGNOSIS — Z7901 Long term (current) use of anticoagulants: Secondary | ICD-10-CM | POA: Diagnosis not present

## 2017-01-26 DIAGNOSIS — Z6831 Body mass index (BMI) 31.0-31.9, adult: Secondary | ICD-10-CM | POA: Diagnosis not present

## 2017-01-26 DIAGNOSIS — Z1389 Encounter for screening for other disorder: Secondary | ICD-10-CM | POA: Diagnosis not present

## 2017-01-26 DIAGNOSIS — Z139 Encounter for screening, unspecified: Secondary | ICD-10-CM | POA: Diagnosis not present

## 2017-01-26 DIAGNOSIS — Z7189 Other specified counseling: Secondary | ICD-10-CM | POA: Diagnosis not present

## 2017-01-26 DIAGNOSIS — E663 Overweight: Secondary | ICD-10-CM | POA: Diagnosis not present

## 2017-01-26 DIAGNOSIS — I4891 Unspecified atrial fibrillation: Secondary | ICD-10-CM | POA: Diagnosis not present

## 2017-01-26 DIAGNOSIS — E119 Type 2 diabetes mellitus without complications: Secondary | ICD-10-CM | POA: Diagnosis not present

## 2017-02-16 DIAGNOSIS — E1140 Type 2 diabetes mellitus with diabetic neuropathy, unspecified: Secondary | ICD-10-CM | POA: Diagnosis not present

## 2017-02-16 DIAGNOSIS — E1169 Type 2 diabetes mellitus with other specified complication: Secondary | ICD-10-CM | POA: Diagnosis not present

## 2017-02-16 DIAGNOSIS — I10 Essential (primary) hypertension: Secondary | ICD-10-CM | POA: Diagnosis not present

## 2017-02-23 DIAGNOSIS — E1140 Type 2 diabetes mellitus with diabetic neuropathy, unspecified: Secondary | ICD-10-CM | POA: Diagnosis not present

## 2017-02-23 DIAGNOSIS — E1169 Type 2 diabetes mellitus with other specified complication: Secondary | ICD-10-CM | POA: Diagnosis not present

## 2017-02-23 DIAGNOSIS — I10 Essential (primary) hypertension: Secondary | ICD-10-CM | POA: Diagnosis not present

## 2017-02-23 DIAGNOSIS — Z5181 Encounter for therapeutic drug level monitoring: Secondary | ICD-10-CM | POA: Diagnosis not present

## 2017-02-23 DIAGNOSIS — E785 Hyperlipidemia, unspecified: Secondary | ICD-10-CM | POA: Diagnosis not present

## 2017-03-25 DIAGNOSIS — Z6831 Body mass index (BMI) 31.0-31.9, adult: Secondary | ICD-10-CM | POA: Diagnosis not present

## 2017-03-25 DIAGNOSIS — I4891 Unspecified atrial fibrillation: Secondary | ICD-10-CM | POA: Diagnosis not present

## 2017-03-25 DIAGNOSIS — Z5181 Encounter for therapeutic drug level monitoring: Secondary | ICD-10-CM | POA: Diagnosis not present

## 2017-03-25 DIAGNOSIS — Z7901 Long term (current) use of anticoagulants: Secondary | ICD-10-CM | POA: Diagnosis not present

## 2017-04-27 DIAGNOSIS — I482 Chronic atrial fibrillation: Secondary | ICD-10-CM | POA: Diagnosis not present

## 2017-04-27 DIAGNOSIS — Z5181 Encounter for therapeutic drug level monitoring: Secondary | ICD-10-CM | POA: Diagnosis not present

## 2017-04-27 DIAGNOSIS — Z7901 Long term (current) use of anticoagulants: Secondary | ICD-10-CM | POA: Diagnosis not present

## 2017-04-27 DIAGNOSIS — Z6830 Body mass index (BMI) 30.0-30.9, adult: Secondary | ICD-10-CM | POA: Diagnosis not present

## 2017-05-20 DIAGNOSIS — Z1331 Encounter for screening for depression: Secondary | ICD-10-CM | POA: Diagnosis not present

## 2017-05-20 DIAGNOSIS — E1129 Type 2 diabetes mellitus with other diabetic kidney complication: Secondary | ICD-10-CM | POA: Diagnosis not present

## 2017-05-20 DIAGNOSIS — Z7901 Long term (current) use of anticoagulants: Secondary | ICD-10-CM | POA: Diagnosis not present

## 2017-05-20 DIAGNOSIS — I482 Chronic atrial fibrillation: Secondary | ICD-10-CM | POA: Diagnosis not present

## 2017-05-20 DIAGNOSIS — Z0000 Encounter for general adult medical examination without abnormal findings: Secondary | ICD-10-CM | POA: Diagnosis not present

## 2017-05-20 DIAGNOSIS — Z139 Encounter for screening, unspecified: Secondary | ICD-10-CM | POA: Diagnosis not present

## 2017-05-20 DIAGNOSIS — Z6831 Body mass index (BMI) 31.0-31.9, adult: Secondary | ICD-10-CM | POA: Diagnosis not present

## 2017-05-20 DIAGNOSIS — Z5181 Encounter for therapeutic drug level monitoring: Secondary | ICD-10-CM | POA: Diagnosis not present

## 2017-06-19 DIAGNOSIS — Z5181 Encounter for therapeutic drug level monitoring: Secondary | ICD-10-CM | POA: Diagnosis not present

## 2017-06-19 DIAGNOSIS — Z6830 Body mass index (BMI) 30.0-30.9, adult: Secondary | ICD-10-CM | POA: Diagnosis not present

## 2017-06-19 DIAGNOSIS — I482 Chronic atrial fibrillation: Secondary | ICD-10-CM | POA: Diagnosis not present

## 2017-06-19 DIAGNOSIS — Z7901 Long term (current) use of anticoagulants: Secondary | ICD-10-CM | POA: Diagnosis not present

## 2017-06-25 DIAGNOSIS — I482 Chronic atrial fibrillation: Secondary | ICD-10-CM | POA: Diagnosis not present

## 2017-07-13 ENCOUNTER — Ambulatory Visit (INDEPENDENT_AMBULATORY_CARE_PROVIDER_SITE_OTHER): Payer: Medicare HMO | Admitting: Podiatry

## 2017-07-13 ENCOUNTER — Encounter: Payer: Self-pay | Admitting: Podiatry

## 2017-07-13 ENCOUNTER — Encounter (HOSPITAL_COMMUNITY): Payer: Self-pay | Admitting: *Deleted

## 2017-07-13 ENCOUNTER — Inpatient Hospital Stay (HOSPITAL_COMMUNITY)
Admission: EM | Admit: 2017-07-13 | Discharge: 2017-07-17 | DRG: 629 | Disposition: A | Discharge status: Home Health | Payer: Medicare HMO | Attending: Family Medicine | Admitting: Family Medicine

## 2017-07-13 ENCOUNTER — Other Ambulatory Visit: Payer: Self-pay

## 2017-07-13 ENCOUNTER — Ambulatory Visit (INDEPENDENT_AMBULATORY_CARE_PROVIDER_SITE_OTHER): Payer: Medicare HMO

## 2017-07-13 VITALS — BP 151/92 | HR 93 | Ht 75.0 in | Wt 240.0 lb

## 2017-07-13 DIAGNOSIS — R600 Localized edema: Secondary | ICD-10-CM | POA: Diagnosis not present

## 2017-07-13 DIAGNOSIS — M109 Gout, unspecified: Secondary | ICD-10-CM | POA: Diagnosis not present

## 2017-07-13 DIAGNOSIS — L089 Local infection of the skin and subcutaneous tissue, unspecified: Secondary | ICD-10-CM | POA: Diagnosis present

## 2017-07-13 DIAGNOSIS — J9811 Atelectasis: Secondary | ICD-10-CM | POA: Diagnosis not present

## 2017-07-13 DIAGNOSIS — L97524 Non-pressure chronic ulcer of other part of left foot with necrosis of bone: Secondary | ICD-10-CM | POA: Diagnosis not present

## 2017-07-13 DIAGNOSIS — Y93I9 Activity, other involving external motion: Secondary | ICD-10-CM | POA: Diagnosis not present

## 2017-07-13 DIAGNOSIS — E11621 Type 2 diabetes mellitus with foot ulcer: Secondary | ICD-10-CM

## 2017-07-13 DIAGNOSIS — Z452 Encounter for adjustment and management of vascular access device: Secondary | ICD-10-CM | POA: Diagnosis not present

## 2017-07-13 DIAGNOSIS — M869 Osteomyelitis, unspecified: Secondary | ICD-10-CM | POA: Diagnosis not present

## 2017-07-13 DIAGNOSIS — L02619 Cutaneous abscess of unspecified foot: Secondary | ICD-10-CM

## 2017-07-13 DIAGNOSIS — L97509 Non-pressure chronic ulcer of other part of unspecified foot with unspecified severity: Secondary | ICD-10-CM

## 2017-07-13 DIAGNOSIS — E119 Type 2 diabetes mellitus without complications: Secondary | ICD-10-CM

## 2017-07-13 DIAGNOSIS — M19072 Primary osteoarthritis, left ankle and foot: Secondary | ICD-10-CM | POA: Diagnosis not present

## 2017-07-13 DIAGNOSIS — L03119 Cellulitis of unspecified part of limb: Secondary | ICD-10-CM | POA: Diagnosis not present

## 2017-07-13 DIAGNOSIS — Z87891 Personal history of nicotine dependence: Secondary | ICD-10-CM

## 2017-07-13 DIAGNOSIS — L97422 Non-pressure chronic ulcer of left heel and midfoot with fat layer exposed: Secondary | ICD-10-CM | POA: Diagnosis not present

## 2017-07-13 DIAGNOSIS — I4891 Unspecified atrial fibrillation: Secondary | ICD-10-CM | POA: Diagnosis present

## 2017-07-13 DIAGNOSIS — L97521 Non-pressure chronic ulcer of other part of left foot limited to breakdown of skin: Secondary | ICD-10-CM

## 2017-07-13 DIAGNOSIS — B9561 Methicillin susceptible Staphylococcus aureus infection as the cause of diseases classified elsewhere: Secondary | ICD-10-CM | POA: Diagnosis not present

## 2017-07-13 DIAGNOSIS — Z7984 Long term (current) use of oral hypoglycemic drugs: Secondary | ICD-10-CM | POA: Diagnosis not present

## 2017-07-13 DIAGNOSIS — S96112A Strain of muscle and tendon of long extensor muscle of toe at ankle and foot level, left foot, initial encounter: Secondary | ICD-10-CM | POA: Diagnosis present

## 2017-07-13 DIAGNOSIS — I10 Essential (primary) hypertension: Secondary | ICD-10-CM | POA: Diagnosis not present

## 2017-07-13 DIAGNOSIS — E1152 Type 2 diabetes mellitus with diabetic peripheral angiopathy with gangrene: Secondary | ICD-10-CM | POA: Diagnosis present

## 2017-07-13 DIAGNOSIS — E7800 Pure hypercholesterolemia, unspecified: Secondary | ICD-10-CM | POA: Diagnosis present

## 2017-07-13 DIAGNOSIS — M79675 Pain in left toe(s): Secondary | ICD-10-CM | POA: Diagnosis not present

## 2017-07-13 DIAGNOSIS — B951 Streptococcus, group B, as the cause of diseases classified elsewhere: Secondary | ICD-10-CM | POA: Diagnosis present

## 2017-07-13 DIAGNOSIS — D72829 Elevated white blood cell count, unspecified: Secondary | ICD-10-CM

## 2017-07-13 DIAGNOSIS — E876 Hypokalemia: Secondary | ICD-10-CM | POA: Diagnosis not present

## 2017-07-13 DIAGNOSIS — E785 Hyperlipidemia, unspecified: Secondary | ICD-10-CM

## 2017-07-13 DIAGNOSIS — E1169 Type 2 diabetes mellitus with other specified complication: Secondary | ICD-10-CM | POA: Diagnosis present

## 2017-07-13 DIAGNOSIS — Z7901 Long term (current) use of anticoagulants: Secondary | ICD-10-CM

## 2017-07-13 DIAGNOSIS — E08621 Diabetes mellitus due to underlying condition with foot ulcer: Secondary | ICD-10-CM

## 2017-07-13 DIAGNOSIS — L97522 Non-pressure chronic ulcer of other part of left foot with fat layer exposed: Secondary | ICD-10-CM

## 2017-07-13 DIAGNOSIS — L97529 Non-pressure chronic ulcer of other part of left foot with unspecified severity: Secondary | ICD-10-CM | POA: Diagnosis not present

## 2017-07-13 DIAGNOSIS — M659 Synovitis and tenosynovitis, unspecified: Secondary | ICD-10-CM | POA: Diagnosis not present

## 2017-07-13 DIAGNOSIS — Z9889 Other specified postprocedural states: Secondary | ICD-10-CM

## 2017-07-13 DIAGNOSIS — M79609 Pain in unspecified limb: Secondary | ICD-10-CM | POA: Diagnosis not present

## 2017-07-13 DIAGNOSIS — L03032 Cellulitis of left toe: Secondary | ICD-10-CM | POA: Diagnosis not present

## 2017-07-13 DIAGNOSIS — M79672 Pain in left foot: Secondary | ICD-10-CM | POA: Diagnosis not present

## 2017-07-13 DIAGNOSIS — W228XXA Striking against or struck by other objects, initial encounter: Secondary | ICD-10-CM | POA: Diagnosis not present

## 2017-07-13 DIAGNOSIS — B964 Proteus (mirabilis) (morganii) as the cause of diseases classified elsewhere: Secondary | ICD-10-CM | POA: Diagnosis present

## 2017-07-13 DIAGNOSIS — L02612 Cutaneous abscess of left foot: Secondary | ICD-10-CM | POA: Diagnosis not present

## 2017-07-13 DIAGNOSIS — M86172 Other acute osteomyelitis, left ankle and foot: Secondary | ICD-10-CM | POA: Diagnosis not present

## 2017-07-13 HISTORY — DX: Unspecified malignant neoplasm of skin, unspecified: C44.90

## 2017-07-13 HISTORY — DX: Type 2 diabetes mellitus with foot ulcer: E11.621

## 2017-07-13 HISTORY — DX: Unspecified atrial fibrillation: I48.91

## 2017-07-13 HISTORY — DX: Personal history of other diseases of the musculoskeletal system and connective tissue: Z87.39

## 2017-07-13 HISTORY — DX: Essential (primary) hypertension: I10

## 2017-07-13 HISTORY — DX: Contact with unspecified agricultural machinery, initial encounter: W30.9XXA

## 2017-07-13 HISTORY — DX: Type 2 diabetes mellitus without complications: E11.9

## 2017-07-13 HISTORY — DX: Unspecified osteoarthritis, unspecified site: M19.90

## 2017-07-13 HISTORY — DX: Bell's palsy: G51.0

## 2017-07-13 HISTORY — DX: Pure hypercholesterolemia, unspecified: E78.00

## 2017-07-13 HISTORY — DX: Hyperlipidemia, unspecified: E78.5

## 2017-07-13 LAB — COMPREHENSIVE METABOLIC PANEL
ALT: 24 U/L (ref 17–63)
ANION GAP: 13 (ref 5–15)
AST: 29 U/L (ref 15–41)
Albumin: 3.1 g/dL — ABNORMAL LOW (ref 3.5–5.0)
Alkaline Phosphatase: 50 U/L (ref 38–126)
BUN: 12 mg/dL (ref 6–20)
CHLORIDE: 97 mmol/L — AB (ref 101–111)
CO2: 27 mmol/L (ref 22–32)
Calcium: 9.4 mg/dL (ref 8.9–10.3)
Creatinine, Ser: 0.78 mg/dL (ref 0.61–1.24)
Glucose, Bld: 112 mg/dL — ABNORMAL HIGH (ref 65–99)
POTASSIUM: 3.8 mmol/L (ref 3.5–5.1)
Sodium: 137 mmol/L (ref 135–145)
TOTAL PROTEIN: 7 g/dL (ref 6.5–8.1)
Total Bilirubin: 0.7 mg/dL (ref 0.3–1.2)

## 2017-07-13 LAB — CBC WITH DIFFERENTIAL/PLATELET
BASOS ABS: 0 10*3/uL (ref 0.0–0.1)
Basophils Relative: 0 %
Eosinophils Absolute: 0 10*3/uL (ref 0.0–0.7)
Eosinophils Relative: 0 %
HCT: 43.3 % (ref 39.0–52.0)
HEMOGLOBIN: 14.6 g/dL (ref 13.0–17.0)
LYMPHS ABS: 2.4 10*3/uL (ref 0.7–4.0)
Lymphocytes Relative: 21 %
MCH: 30.9 pg (ref 26.0–34.0)
MCHC: 33.7 g/dL (ref 30.0–36.0)
MCV: 91.5 fL (ref 78.0–100.0)
Monocytes Absolute: 0.9 10*3/uL (ref 0.1–1.0)
Monocytes Relative: 8 %
Neutro Abs: 7.8 10*3/uL — ABNORMAL HIGH (ref 1.7–7.7)
Neutrophils Relative %: 71 %
Platelets: 217 10*3/uL (ref 150–400)
RBC: 4.73 MIL/uL (ref 4.22–5.81)
RDW: 13.3 % (ref 11.5–15.5)
WBC: 11.1 10*3/uL — AB (ref 4.0–10.5)

## 2017-07-13 LAB — GLUCOSE, CAPILLARY: GLUCOSE-CAPILLARY: 133 mg/dL — AB (ref 65–99)

## 2017-07-13 LAB — I-STAT CG4 LACTIC ACID, ED: LACTIC ACID, VENOUS: 1.46 mmol/L (ref 0.5–1.9)

## 2017-07-13 MED ORDER — POLYETHYLENE GLYCOL 3350 17 G PO PACK: 17.0000 g | PACK | Freq: Every day | ORAL | Status: DC | PRN

## 2017-07-13 MED ORDER — INSULIN ASPART 100 UNIT/ML ~~LOC~~ SOLN
0.0000 [IU] | Freq: Three times a day (TID) | SUBCUTANEOUS | Status: DC
  Administered 2017-07-14: 3 [IU] via SUBCUTANEOUS
  Administered 2017-07-15 (×2): 2 [IU] via SUBCUTANEOUS
  Administered 2017-07-16 – 2017-07-17 (×2): 3 [IU] via SUBCUTANEOUS
  Administered 2017-07-17: 2 [IU] via SUBCUTANEOUS

## 2017-07-13 MED ORDER — PIPERACILLIN-TAZOBACTAM 3.375 G IVPB 30 MIN
3.3750 g | Freq: Once | INTRAVENOUS | Status: AC
  Administered 2017-07-13: 3.375 g via INTRAVENOUS
  Filled 2017-07-13: qty 50

## 2017-07-13 MED ORDER — ACETAMINOPHEN 650 MG RE SUPP
650.0000 mg | Freq: Four times a day (QID) | RECTAL | Status: DC | PRN
  Administered 2017-07-14: 325 mg via RECTAL

## 2017-07-13 MED ORDER — MORPHINE SULFATE (PF) 2 MG/ML IV SOLN
2.0000 mg | INTRAVENOUS | Status: DC | PRN
  Administered 2017-07-16 – 2017-07-17 (×2): 2 mg via INTRAVENOUS
  Filled 2017-07-13 (×2): qty 1

## 2017-07-13 MED ORDER — INSULIN ASPART 100 UNIT/ML ~~LOC~~ SOLN: 0.0000 [IU] | Freq: Every day | SUBCUTANEOUS | Status: DC

## 2017-07-13 MED ORDER — SODIUM CHLORIDE 0.9 % IV SOLN
1250.0000 mg | INTRAVENOUS | Status: DC
  Filled 2017-07-13: qty 1250

## 2017-07-13 MED ORDER — VANCOMYCIN HCL 10 G IV SOLR
2000.0000 mg | Freq: Once | INTRAVENOUS | Status: AC
  Administered 2017-07-13: 2000 mg via INTRAVENOUS
  Filled 2017-07-13: qty 2000

## 2017-07-13 MED ORDER — DIGOXIN 125 MCG PO TABS
250.0000 ug | ORAL_TABLET | Freq: Every day | ORAL | Status: DC
  Administered 2017-07-14 – 2017-07-17 (×3): 250 ug via ORAL
  Filled 2017-07-13 (×3): qty 2

## 2017-07-13 MED ORDER — TRIAMTERENE-HCTZ 37.5-25 MG PO CAPS
1.0000 | ORAL_CAPSULE | Freq: Every day | ORAL | Status: DC
  Administered 2017-07-14 – 2017-07-17 (×3): 1 via ORAL
  Filled 2017-07-13 (×4): qty 1

## 2017-07-13 MED ORDER — LISINOPRIL 40 MG PO TABS
40.0000 mg | ORAL_TABLET | Freq: Two times a day (BID) | ORAL | Status: DC
  Administered 2017-07-14 – 2017-07-17 (×7): 40 mg via ORAL
  Filled 2017-07-13 (×7): qty 1

## 2017-07-13 MED ORDER — FENOFIBRATE 160 MG PO TABS
160.0000 mg | ORAL_TABLET | Freq: Every day | ORAL | Status: DC
  Administered 2017-07-14 – 2017-07-16 (×3): 160 mg via ORAL
  Filled 2017-07-13 (×4): qty 1

## 2017-07-13 MED ORDER — ONDANSETRON HCL 4 MG PO TABS
4.0000 mg | ORAL_TABLET | Freq: Four times a day (QID) | ORAL | Status: DC | PRN
Start: 2017-07-13 — End: 2017-07-17

## 2017-07-13 MED ORDER — METFORMIN HCL 500 MG PO TABS
1000.0000 mg | ORAL_TABLET | Freq: Two times a day (BID) | ORAL | Status: DC
  Administered 2017-07-14 – 2017-07-17 (×6): 1000 mg via ORAL
  Filled 2017-07-13 (×6): qty 2

## 2017-07-13 MED ORDER — ONDANSETRON HCL 4 MG/2ML IJ SOLN: 4.0000 mg | Freq: Four times a day (QID) | INTRAMUSCULAR | Status: DC | PRN

## 2017-07-13 MED ORDER — ACETAMINOPHEN 325 MG PO TABS
650.0000 mg | ORAL_TABLET | Freq: Four times a day (QID) | ORAL | Status: DC | PRN
  Administered 2017-07-15: 325 mg via ORAL
  Filled 2017-07-13: qty 2

## 2017-07-13 MED ORDER — METOPROLOL TARTRATE 100 MG PO TABS
100.0000 mg | ORAL_TABLET | Freq: Every day | ORAL | Status: DC
  Administered 2017-07-14 – 2017-07-17 (×4): 100 mg via ORAL
  Filled 2017-07-13: qty 2
  Filled 2017-07-13 (×3): qty 1

## 2017-07-13 MED ORDER — PIPERACILLIN-TAZOBACTAM 3.375 G IVPB
3.3750 g | Freq: Three times a day (TID) | INTRAVENOUS | Status: DC
  Administered 2017-07-14 – 2017-07-17 (×9): 3.375 g via INTRAVENOUS
  Filled 2017-07-13 (×12): qty 50

## 2017-07-13 NOTE — ED Notes (Signed)
Pt and family requesting to be seen by private physician in ED, as per personal private physician, Evelina Bucy, MD. Charge nurse consulted, and pt informed that he will be seen by ED staff only, and a consult upon possible admission will occur with private physician, as needed. Pt and family voiced understanding.

## 2017-07-13 NOTE — ED Triage Notes (Addendum)
Pt has infection to left big toe, sent here for admission and iv antibiotics. No acute distress is noted at triage.

## 2017-07-13 NOTE — ED Notes (Signed)
Attempted report 

## 2017-07-13 NOTE — Progress Notes (Signed)
Pharmacy Antibiotic Note  Chad Burgess is a 77 y.o. male admitted on 07/13/2017 with wound infection.  Pharmacy has been consulted for vancomycin and Zosyn dosing.  Afebrile, WBC 11.1. SCr stable, normalized CrCl ~76ml/min  Plan: Give vancomycin 2g IV x 1, then start vancomycin 1,250mg  IV Q24h Start Zosyn 3.375 gm IV q8h (4 hour infusion) Monitor clinical picture, renal function, VT prn F/U C&S, abx deescalation / LOT     Temp (24hrs), Avg:98.6 F (37 C), Min:98.6 F (37 C), Max:98.6 F (37 C)  Recent Labs  Lab 07/13/17 1515 07/13/17 1535  WBC 11.1*  --   CREATININE 0.78  --   LATICACIDVEN  --  1.46    Estimated Creatinine Clearance: 103.1 mL/min (by C-G formula based on SCr of 0.78 mg/dL).    No Known Allergies  Thank you for allowing pharmacy to be a part of this patient's care.  Reginia Naas 07/13/2017 10:00 PM
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
## 2017-07-14 ENCOUNTER — Inpatient Hospital Stay (HOSPITAL_COMMUNITY): Payer: Medicare HMO

## 2017-07-14 ENCOUNTER — Encounter (HOSPITAL_COMMUNITY): Payer: Self-pay | Admitting: General Practice

## 2017-07-14 DIAGNOSIS — E118 Type 2 diabetes mellitus with unspecified complications: Secondary | ICD-10-CM

## 2017-07-14 DIAGNOSIS — L97529 Non-pressure chronic ulcer of other part of left foot with unspecified severity: Secondary | ICD-10-CM

## 2017-07-14 DIAGNOSIS — E11621 Type 2 diabetes mellitus with foot ulcer: Principal | ICD-10-CM

## 2017-07-14 LAB — BASIC METABOLIC PANEL
ANION GAP: 12 (ref 5–15)
BUN: 12 mg/dL (ref 6–20)
CALCIUM: 9.2 mg/dL (ref 8.9–10.3)
CO2: 26 mmol/L (ref 22–32)
Chloride: 100 mmol/L — ABNORMAL LOW (ref 101–111)
Creatinine, Ser: 0.78 mg/dL (ref 0.61–1.24)
Glucose, Bld: 146 mg/dL — ABNORMAL HIGH (ref 65–99)
Potassium: 3.4 mmol/L — ABNORMAL LOW (ref 3.5–5.1)
Sodium: 138 mmol/L (ref 135–145)

## 2017-07-14 LAB — C-REACTIVE PROTEIN: CRP: 13.8 mg/dL — ABNORMAL HIGH (ref <1.0)

## 2017-07-14 LAB — CBC
HCT: 40.4 % (ref 39.0–52.0)
HEMOGLOBIN: 13.7 g/dL (ref 13.0–17.0)
MCH: 30.8 pg (ref 26.0–34.0)
MCHC: 33.9 g/dL (ref 30.0–36.0)
MCV: 90.8 fL (ref 78.0–100.0)
Platelets: 183 10*3/uL (ref 150–400)
RBC: 4.45 MIL/uL (ref 4.22–5.81)
RDW: 13.3 % (ref 11.5–15.5)
WBC: 8.8 10*3/uL (ref 4.0–10.5)

## 2017-07-14 LAB — SEDIMENTATION RATE: Sed Rate: 40 mm/hr — ABNORMAL HIGH (ref 0–16)

## 2017-07-14 LAB — PROTIME-INR
INR: 1.73
PROTHROMBIN TIME: 20.1 s — AB (ref 11.4–15.2)

## 2017-07-14 LAB — GLUCOSE, CAPILLARY
GLUCOSE-CAPILLARY: 113 mg/dL — AB (ref 65–99)
GLUCOSE-CAPILLARY: 114 mg/dL — AB (ref 65–99)
Glucose-Capillary: 169 mg/dL — ABNORMAL HIGH (ref 65–99)
Glucose-Capillary: 131 mg/dL — ABNORMAL HIGH (ref 65–99)

## 2017-07-14 MED ORDER — POTASSIUM CHLORIDE CRYS ER 20 MEQ PO TBCR
20.0000 meq | EXTENDED_RELEASE_TABLET | Freq: Every day | ORAL | Status: DC
  Administered 2017-07-14 – 2017-07-17 (×3): 20 meq via ORAL
  Filled 2017-07-14 (×3): qty 1

## 2017-07-14 MED ORDER — VANCOMYCIN HCL IN DEXTROSE 750-5 MG/150ML-% IV SOLN
750.0000 mg | Freq: Two times a day (BID) | INTRAVENOUS | Status: DC
  Administered 2017-07-14 – 2017-07-16 (×4): 750 mg via INTRAVENOUS
  Filled 2017-07-14 (×5): qty 150
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
## 2017-07-14 NOTE — Progress Notes (Signed)
Pharmacy Antibiotic Note  Chad Burgess is a 77 y.o. male admitted on 07/13/2017 with wound infection.  Pharmacy dosing vancomycin and Zosyn dosing.  Afebrile, WBC 11.1>8.8. SCr stable, normalized CrCl ~68ml/min  Plan: Increase vancomycin to 750 mg IV Q 12 hours since MRI shows some concern for osteo Start Zosyn 3.375 gm IV q8h (4 hour infusion) Monitor clinical picture, renal function, VT prn F/U C&S, abx deescalation / LOT     Temp (24hrs), Avg:98.2 F (36.8 C), Min:97.3 F (36.3 C), Max:98.7 F (37.1 C)  Recent Labs  Lab 07/13/17 1515 07/13/17 1535 07/14/17 0225  WBC 11.1*  --  8.8  CREATININE 0.78  --  0.78  LATICACIDVEN  --  1.46  --     Estimated Creatinine Clearance: 103.1 mL/min (by C-G formula based on SCr of 0.78 mg/dL).    No Known Allergies  Thank you for allowing pharmacy to be a part of this patient's care.  Albertina Parr, PharmD., BCPS Clinical Pharmacist Pager [phone]

## 2017-07-14 NOTE — Progress Notes (Signed)
Pt lost his IV access, IV consult in already ,iv team present at bedside trying to get an access, Vanc and zosyn on hold, got a report from phlebotomist that they could not get blood draw from patient, will continue to monitor.

## 2017-07-14 NOTE — Progress Notes (Signed)
Subjective:  Patient ID: Chad Burgess, male    DOB: [birth year],  MRN: [id number]  Patient seen at bedside with his family present. Denies complaints. Reports decreased redness to the foot. Objective:   Vitals:   07/14/17 1752 07/14/17 2201  BP: 140/66 127/68  Pulse: (!) 106 84  Resp:  16  Temp: 98.7 F (37.1 C) 97.8 F (36.6 C)  SpO2: 97% 96%   General AA&O x3. Normal mood and affect.  Vascular L foot warm to touch. Excessive distal cooling noted.  Neurologic Epicritic sensation grossly intact.  Dermatologic Continued erythema L foot, slightly decreased. Slight continued purulence at the lateral aspect of the L hallux.  Orthopedic: MMT 5/5 in dorsiflexion, plantarflexion, inversion, and eversion. Normal joint ROM without pain or crepitus.   Results for orders placed or performed during the hospital encounter of 07/13/17 (from the past 24 hour(s))  Glucose, capillary     Status: Abnormal   Collection Time: 07/13/17 11:48 PM  Result Value Ref Range   Glucose-Capillary 133 (H) 65 - 99 mg/dL  C-reactive protein     Status: Abnormal   Collection Time: 07/14/17  2:25 AM  Result Value Ref Range   CRP 13.8 (H) <1.0 mg/dL  Sedimentation rate     Status: Abnormal   Collection Time: 07/14/17  2:25 AM  Result Value Ref Range   Sed Rate 40 (H) 0 - 16 mm/hr  Basic metabolic panel     Status: Abnormal   Collection Time: 07/14/17  2:25 AM  Result Value Ref Range   Sodium 138 135 - 145 mmol/L   Potassium 3.4 (L) 3.5 - 5.1 mmol/L   Chloride 100 (L) 101 - 111 mmol/L   CO2 26 22 - 32 mmol/L   Glucose, Bld 146 (H) 65 - 99 mg/dL   BUN 12 6 - 20 mg/dL   Creatinine, Ser 0.78 0.61 - 1.24 mg/dL   Calcium 9.2 8.9 - 10.3 mg/dL   GFR calc non Af Amer >60 >60 mL/min   GFR calc Af Amer >60 >60 mL/min   Anion gap 12 5 - 15  CBC     Status: None   Collection Time: 07/14/17  2:25 AM  Result Value Ref Range   WBC 8.8 4.0 - 10.5 K/uL   RBC 4.45 4.22 - 5.81 MIL/uL   Hemoglobin 13.7 13.0 -  17.0 g/dL   HCT 40.4 39.0 - 52.0 %   MCV 90.8 78.0 - 100.0 fL   MCH 30.8 26.0 - 34.0 pg   MCHC 33.9 30.0 - 36.0 g/dL   RDW 13.3 11.5 - 15.5 %   Platelets 183 150 - 400 K/uL  Protime-INR     Status: Abnormal   Collection Time: 07/14/17  2:25 AM  Result Value Ref Range   Prothrombin Time 20.1 (H) 11.4 - 15.2 seconds   INR 1.73   Glucose, capillary     Status: Abnormal   Collection Time: 07/14/17  6:20 AM  Result Value Ref Range   Glucose-Capillary 113 (H) 65 - 99 mg/dL  Glucose, capillary     Status: Abnormal   Collection Time: 07/14/17 11:14 AM  Result Value Ref Range   Glucose-Capillary 114 (H) 65 - 99 mg/dL  Glucose, capillary     Status: Abnormal   Collection Time: 07/14/17  4:12 PM  Result Value Ref Range   Glucose-Capillary 169 (H) 65 - 99 mg/dL  Glucose, capillary     Status: Abnormal   Collection Time: 07/14/17 10:00 PM  Result Value Ref Range   Glucose-Capillary 131 (H) 65 - 99 mg/dL    Assessment & Plan:  Patient was evaluated and treated and all questions answered.  L Foot Infection -Erythema reduced but still elevated. Slight continued purulence from lateral foot wound noted today. -Labs reviewed. Elevated ESR, CRP. -MRI reviewed. Concern for OM/septic joint. -Awaiting vascular studies. Per RN scheduled to be performed 07/15/17. -Dressing changed today. Betadine and DSD. -Patient will likely benefit from operative debridement. Await vascular studies prior to discussion. May plan for debridement Thursday. -Continue IV abx.  Podiatry will follow.

## 2017-07-15 ENCOUNTER — Inpatient Hospital Stay (HOSPITAL_COMMUNITY): Payer: Medicare HMO

## 2017-07-15 DIAGNOSIS — M79609 Pain in unspecified limb: Secondary | ICD-10-CM

## 2017-07-15 LAB — CBC WITH DIFFERENTIAL/PLATELET
BASOS ABS: 0 10*3/uL (ref 0.0–0.1)
Basophils Relative: 0 %
Eosinophils Absolute: 0.1 10*3/uL (ref 0.0–0.7)
Eosinophils Relative: 1 %
HEMATOCRIT: 42.3 % (ref 39.0–52.0)
Hemoglobin: 14.3 g/dL (ref 13.0–17.0)
LYMPHS PCT: 20 %
Lymphs Abs: 1.4 10*3/uL (ref 0.7–4.0)
MCH: 30.7 pg (ref 26.0–34.0)
MCHC: 33.8 g/dL (ref 30.0–36.0)
MCV: 90.8 fL (ref 78.0–100.0)
MONO ABS: 0.9 10*3/uL (ref 0.1–1.0)
Monocytes Relative: 13 %
NEUTROS ABS: 4.7 10*3/uL (ref 1.7–7.7)
Neutrophils Relative %: 66 %
Platelets: 206 10*3/uL (ref 150–400)
RBC: 4.66 MIL/uL (ref 4.22–5.81)
RDW: 13.2 % (ref 11.5–15.5)
WBC: 7.1 10*3/uL (ref 4.0–10.5)

## 2017-07-15 LAB — RENAL FUNCTION PANEL
ALBUMIN: 2.6 g/dL — AB (ref 3.5–5.0)
ANION GAP: 16 — AB (ref 5–15)
BUN: 10 mg/dL (ref 6–20)
CO2: 24 mmol/L (ref 22–32)
Calcium: 9.1 mg/dL (ref 8.9–10.3)
Chloride: 98 mmol/L — ABNORMAL LOW (ref 101–111)
Creatinine, Ser: 0.83 mg/dL (ref 0.61–1.24)
GFR calc Af Amer: >60 mL/min (ref >60)
GFR calc non Af Amer: >60 mL/min (ref >60)
GLUCOSE: 126 mg/dL — AB (ref 65–99)
PHOSPHORUS: 2.9 mg/dL (ref 2.5–4.6)
POTASSIUM: 3.3 mmol/L — AB (ref 3.5–5.1)
Sodium: 138 mmol/L (ref 135–145)

## 2017-07-15 LAB — GLUCOSE, CAPILLARY
GLUCOSE-CAPILLARY: 145 mg/dL — AB (ref 65–99)
Glucose-Capillary: 128 mg/dL — ABNORMAL HIGH (ref 65–99)
Glucose-Capillary: 114 mg/dL — ABNORMAL HIGH (ref 65–99)
Glucose-Capillary: 125 mg/dL — ABNORMAL HIGH (ref 65–99)

## 2017-07-15 MED ORDER — ENOXAPARIN SODIUM 100 MG/ML ~~LOC~~ SOLN
100.0000 mg | Freq: Two times a day (BID) | SUBCUTANEOUS | Status: DC
Start: 2017-07-15 — End: 2017-07-16
  Administered 2017-07-15: 100 mg via SUBCUTANEOUS
  Filled 2017-07-15 (×3): qty 1

## 2017-07-15 NOTE — H&P (View-Only) (Signed)
  Subjective:  Patient ID: Chad Burgess, male    DOB: 08/07/1940,  MRN: [id number]  Seen at bedside. Eating lunch, feeling well. Denies complaints. Objective:   Vitals:   07/15/17 1021 07/15/17 1100  BP: (!) 161/90 138/72  Pulse: (!) 116 78  Resp: 18 18  Temp: 97.9 F (36.6 C) 98 F (36.7 C)  SpO2:  97%   General AA&O x3. Normal mood and affect.  Vascular Foot warm to touch with excessive distal cooling  Neurologic Epicritic sensation grossly intact.  Dermatologic Reduced erythema L foot. No local warmth. Still some continued purulence at the lateral aspect of the L hallux.  Orthopedic: Pain to palpation about the L great toe.    Assessment & Plan:  Patient was evaluated and treated and all questions answered.  L Great Toe Infection; ?Septic joint -ABIs pending. To be performed shortly. -Will plan for debridement and irrigation of the L foot wound with hallux IPJ/MPJ arthrotomy. Will obtain bone specimen for abx implications. No plans for amputation of the digit at this time. Will plan for abx therapy to treat infection. -Patient will likely be ok for discharge Friday.  -Would recommend PICC line for abx at discharge with [place]. Abx and duration to be determined later based upon operative findings.  Podiatry will continue to follow. Please call with questions or concerns.  Total floor time: 30 minutes.

## 2017-07-15 NOTE — Progress Notes (Signed)
Hospitalist progress note   Chad Burgess  VVO:[id number] DOB: [birth year] DOA: 07/13/2017 PCP: Maryella Shivers, MD   Specialists:   podiatry Dr. March Rummage  Brief Narrative:  77 year old male, diabetes mellitus type 2, atrial fibrillation chads score >2 on Coumadin, HTN, hyperlipidemia admitted after accidental callus on the left side of his foot secondary to this being caught on a wire had bloody discharge shooting pains and was immediately sent to the ED to rule out deeper infection Podiatry to see  Assessment & Plan:   Assessment:  The primary encounter diagnosis was Diabetic ulcer of toe of left foot associated with diabetes mellitus due to underlying condition, with fat layer exposed ([place]). A diagnosis of Leukocytosis, unspecified type was also pertinent to this visit.  Foot wound-Dr. March Rummage recommended ABI which are pending.  MRI 2/5 = possible osseous edema?  Arthropathy,?  Early osteomyelitis as well as potential partial tear of extensor tendon grade 2-as well as fluid collection-we will continue antibiotics at this time with vancomycin and Zosyn-usually polymicrobial so wouldn't narrow right now  Await ABI, whether operative management is needed as per podiatry-white count down from 11-7 ESR CRP were elevated.  Diabetes mellitus type 2-continue metformin 1000 twice daily, SSI minimal amounts, sugars 128-131  Atrial fibrillation chads score >2 continue metoprolol 100 daily-continue digoxin 250 mcg daily warfarin is on hold last INR was 1.7 on 2/5-transition to Lovenox twice daily for atrial fibrillation and can be turned off 8-12 hours prior to potential operative management  HTN continue Dyazide by mouth daily, continue lisinopril 40 twice daily, slightly elevated so resuming clonidine  Hypokalemia-recheck be met in a.m.   DVT prophylaxis: Lovenox code Status:   Full code   family Communication:   Inpatient Disposition Plan: Unclear at present   Consultants:    Podiatry  Procedures:   None  Antimicrobials:   None  Subjective:  Awake alert had some pain overnight needed medication eating everything in sight Seems to understand that he needs a couple more tests done before we can decide on operative management No chest pain no chills   Objective: Vitals:   07/14/17 1309 07/14/17 1752 07/14/17 2201 07/15/17 0407  BP: (!) 153/83 140/66 127/68 (!) 161/90  Pulse: 81 (!) 106 84 (!) 116  Resp:   16 18  Temp: 97.7 F (36.5 C) 98.7 F (37.1 C) 97.8 F (36.6 C) 97.9 F (36.6 C)  TempSrc: Oral Oral Oral Oral  SpO2: 97% 97% 96% 98%    Intake/Output Summary (Last 24 hours) at 07/15/2017 2694 Last data filed at 07/15/2017 0407 Gross per 24 hour  Intake 200 ml  Output 1550 ml  Net -1350 ml   There were no vitals filed for this visit.  Examination:  Awake alert no pallor no ict, EOMI ncat s1 s2 no mrg Chest clear without rales nor rhonchi, trachea midline No SM lymphadenopathy, no other lymphadenopathy in axialla abd soft nt nd no rebound no guard Neuro intact moving 4 limbs equally-no deficit and power 55, reflexes not tested sensory wnl Skin examined on lower extremity but wound not examined today  Data Reviewed: I have personally reviewed following labs and imaging studies  CBC: Recent Labs  Lab 07/13/17 1515 07/14/17 0225 07/15/17 0636  WBC 11.1* 8.8 7.1  NEUTROABS 7.8*  --  4.7  HGB 14.6 13.7 14.3  HCT 43.3 40.4 42.3  MCV 91.5 90.8 90.8  PLT 217 183 854   Basic Metabolic Panel: Recent Labs  Lab 07/13/17 1515 07/14/17 0225  NA 137 138  K 3.8 3.4*  CL 97* 100*  CO2 27 26  GLUCOSE 112* 146*  BUN 12 12  CREATININE 0.78 0.78  CALCIUM 9.4 9.2   GFR: Estimated Creatinine Clearance: 103.1 mL/min (by C-G formula based on SCr of 0.78 mg/dL). Liver Function Tests: Recent Labs  Lab 07/13/17 1515  AST 29  ALT 24  ALKPHOS 50  BILITOT 0.7  PROT 7.0  ALBUMIN 3.1*   No results for input(s): LIPASE, AMYLASE in  the last 168 hours. No results for input(s): AMMONIA in the last 168 hours. Coagulation Profile: Recent Labs  Lab 07/14/17 0225  INR 1.73   Cardiac Enzymes: No results for input(s): CKTOTAL, CKMB, CKMBINDEX, TROPONINI in the last 168 hours. CBG: Recent Labs  Lab 07/14/17 0620 07/14/17 1114 07/14/17 1612 07/14/17 2200 07/15/17 0627  GLUCAP 113* 114* 169* 131* 128*   Urine analysis: No results found for: COLORURINE, APPEARANCEUR, LABSPEC, PHURINE, GLUCOSEU, HGBUR, BILIRUBINUR, KETONESUR, PROTEINUR, UROBILINOGEN, NITRITE, LEUKOCYTESUR   Radiology Studies: Reviewed images personally in health database    Scheduled Meds: . digoxin  250 mcg Oral Daily  . fenofibrate  160 mg Oral Daily  . insulin aspart  0-15 Units Subcutaneous TID WC  . insulin aspart  0-5 Units Subcutaneous QHS  . lisinopril  40 mg Oral BID  . metFORMIN  1,000 mg Oral BID WC  . metoprolol tartrate  100 mg Oral Daily  . potassium chloride  20 mEq Oral Daily  . triamterene-hydrochlorothiazide  1 capsule Oral Daily   Continuous Infusions: . piperacillin-tazobactam (ZOSYN)  IV 3.375 g (07/15/17 0530)  . vancomycin 750 mg (07/15/17 0013)     LOS: 2 days    Time spent: [place], MD [facility]   If 7PM-7AM, please contact night-coverage [email or web address] Password [facility] 07/15/2017, 8:23 AM

## 2017-07-15 NOTE — Progress Notes (Signed)
  Subjective:  Patient ID: Chad Burgess, male    DOB: 1941/02/04,  MRN: [id number]  Seen at bedside. Eating lunch, feeling well. Denies complaints. Objective:   Vitals:   07/15/17 1021 07/15/17 1100  BP: (!) 161/90 138/72  Pulse: (!) 116 78  Resp: 18 18  Temp: 97.9 F (36.6 C) 98 F (36.7 C)  SpO2:  97%   General AA&O x3. Normal mood and affect.  Vascular Foot warm to touch with excessive distal cooling  Neurologic Epicritic sensation grossly intact.  Dermatologic Reduced erythema L foot. No local warmth. Still some continued purulence at the lateral aspect of the L hallux.  Orthopedic: Pain to palpation about the L great toe.    Assessment & Plan:  Patient was evaluated and treated and all questions answered.  L Great Toe Infection; ?Septic joint -ABIs pending. To be performed shortly. -Will plan for debridement and irrigation of the L foot wound with hallux IPJ/MPJ arthrotomy. Will obtain bone specimen for abx implications. No plans for amputation of the digit at this time. Will plan for abx therapy to treat infection. -Patient will likely be ok for discharge Friday.  -Would recommend PICC line for abx at discharge with [place]. Abx and duration to be determined later based upon operative findings.  Podiatry will continue to follow. Please call with questions or concerns.  Total floor time: 30 minutes.
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
## 2017-07-15 NOTE — Progress Notes (Signed)
VASCULAR LAB PRELIMINARY  ARTERIAL  ABI completed:    RIGHT    LEFT    PRESSURE WAVEFORM  PRESSURE WAVEFORM  BRACHIAL 149 Triphasic BRACHIAL 162 Triphasci  DP 234 Biphasic DP 188 Monophasic  AT   AT    PT 255 Monophasic PT 131 Monophasic  PER   PER    GREAT TOE  NA GREAT TOE  NA    RIGHT LEFT  ABI 1.44 1.16    The right ABI is elevated at rest, suggestive of medial calcification. The left ABI is within normal limits at rest. However, given abnormal waveforms, the left ABI is likely falsely elevated at rest.  07/15/2017 3:25 PM Maudry Mayhew, BS, RVT, RDCS, RDMS

## 2017-07-15 NOTE — Progress Notes (Addendum)
0700 Bedside shift report, pt resting in bed, A&Ox4, NAD, fall precautions in place, no complaints, WCTM.  0800 Pt assessed, see flow sheet. Medicated per MAR. Dressing to left foot, great toe with drainage present. Pt denies pain. WCTM.  1000 Pt up with physical therapy. Pt resting comfortably in chair.   1200 Pt sitting up in chair visiting with spouse and dgt. Pt denies pain, no complaints at this time. Updated on POC. Fall precautions in place, [facility].   94 Podiatry MD here to see pt, changed dressing to left foot, new orders received, WCTM.   59 Pt assisted back to bed from chair. Pt downstairs for vascular testing via bed and transporter.   1530 Pt back from ABI testing, resting in bed, NAD, no complaints, WCTM.   1700 Pt finishing dinner, visiting with daughters x2 and wife at bedside. No complaints.   1905 Bedside shift report, pt resting in chair, family at bedside, NAD, no complaints.

## 2017-07-15 NOTE — Plan of Care (Signed)
  Education: Knowledge of General Education information will improve 07/15/2017 0530 - Progressing by Anson Fret, RN Note POC reviewed with pt.- and abx therapy.

## 2017-07-16 ENCOUNTER — Inpatient Hospital Stay (HOSPITAL_COMMUNITY): Payer: Medicare HMO | Admitting: Anesthesiology

## 2017-07-16 ENCOUNTER — Inpatient Hospital Stay (HOSPITAL_COMMUNITY): Payer: Medicare HMO

## 2017-07-16 ENCOUNTER — Encounter (HOSPITAL_COMMUNITY): Payer: Self-pay | Admitting: Anesthesiology

## 2017-07-16 ENCOUNTER — Encounter (HOSPITAL_COMMUNITY)
Admission: EM | Disposition: A | Discharge status: Home Health | Payer: Self-pay | Source: Home / Self Care | Attending: Family Medicine

## 2017-07-16 DIAGNOSIS — M109 Gout, unspecified: Secondary | ICD-10-CM

## 2017-07-16 DIAGNOSIS — M659 Synovitis and tenosynovitis, unspecified: Secondary | ICD-10-CM

## 2017-07-16 DIAGNOSIS — L02612 Cutaneous abscess of left foot: Secondary | ICD-10-CM

## 2017-07-16 DIAGNOSIS — L97524 Non-pressure chronic ulcer of other part of left foot with necrosis of bone: Secondary | ICD-10-CM

## 2017-07-16 DIAGNOSIS — L03032 Cellulitis of left toe: Secondary | ICD-10-CM

## 2017-07-16 HISTORY — PX: IRRIGATION AND DEBRIDEMENT FOOT: SHX6602

## 2017-07-16 LAB — CBC WITH DIFFERENTIAL/PLATELET
BASOS ABS: 0 10*3/uL (ref 0.0–0.1)
Basophils Relative: 0 %
Eosinophils Absolute: 0.2 10*3/uL (ref 0.0–0.7)
Eosinophils Relative: 2 %
HEMATOCRIT: 43.8 % (ref 39.0–52.0)
Hemoglobin: 14.9 g/dL (ref 13.0–17.0)
Lymphocytes Relative: 16 %
Lymphs Abs: 1.5 10*3/uL (ref 0.7–4.0)
MCH: 30.4 pg (ref 26.0–34.0)
MCHC: 34 g/dL (ref 30.0–36.0)
MCV: 89.4 fL (ref 78.0–100.0)
MONO ABS: 1 10*3/uL (ref 0.1–1.0)
Monocytes Relative: 10 %
NEUTROS ABS: 6.5 10*3/uL (ref 1.7–7.7)
NEUTROS PCT: 72 %
Platelets: 232 10*3/uL (ref 150–400)
RBC: 4.9 MIL/uL (ref 4.22–5.81)
RDW: 12.9 % (ref 11.5–15.5)
WBC: 9.1 10*3/uL (ref 4.0–10.5)

## 2017-07-16 LAB — BASIC METABOLIC PANEL
Anion gap: 12 (ref 5–15)
BUN: 11 mg/dL (ref 6–20)
CALCIUM: 9.4 mg/dL (ref 8.9–10.3)
CO2: 25 mmol/L (ref 22–32)
Chloride: 98 mmol/L — ABNORMAL LOW (ref 101–111)
Creatinine, Ser: 0.8 mg/dL (ref 0.61–1.24)
GFR calc Af Amer: >60 mL/min (ref >60)
Glucose, Bld: 126 mg/dL — ABNORMAL HIGH (ref 65–99)
Potassium: 3.6 mmol/L (ref 3.5–5.1)
Sodium: 135 mmol/L (ref 135–145)

## 2017-07-16 LAB — GLUCOSE, CAPILLARY
GLUCOSE-CAPILLARY: 112 mg/dL — AB (ref 65–99)
Glucose-Capillary: 126 mg/dL — ABNORMAL HIGH (ref 65–99)
Glucose-Capillary: 118 mg/dL — ABNORMAL HIGH (ref 65–99)
Glucose-Capillary: 183 mg/dL — ABNORMAL HIGH (ref 65–99)
Glucose-Capillary: 161 mg/dL — ABNORMAL HIGH (ref 65–99)

## 2017-07-16 LAB — SYNOVIAL FLUID, CRYSTAL

## 2017-07-16 SURGERY — IRRIGATION AND DEBRIDEMENT FOOT
Anesthesia: General | Laterality: Left

## 2017-07-16 MED ORDER — 0.9 % SODIUM CHLORIDE (POUR BTL) OPTIME
TOPICAL | Status: DC | PRN
Start: 2017-07-16 — End: 2017-07-16
  Administered 2017-07-16: 1000 mL

## 2017-07-16 MED ORDER — BUPIVACAINE HCL (PF) 0.5 % IJ SOLN
INTRAMUSCULAR | Status: DC | PRN
  Administered 2017-07-16: 10 mL

## 2017-07-16 MED ORDER — VANCOMYCIN HCL IN DEXTROSE 1-5 GM/200ML-% IV SOLN
1000.0000 mg | Freq: Two times a day (BID) | INTRAVENOUS | Status: DC
  Administered 2017-07-17: 1000 mg via INTRAVENOUS
  Filled 2017-07-16 (×3): qty 200

## 2017-07-16 MED ORDER — ONDANSETRON HCL 4 MG/2ML IJ SOLN: 4.0000 mg | Freq: Once | INTRAMUSCULAR | Status: DC | PRN

## 2017-07-16 MED ORDER — FENTANYL CITRATE (PF) 250 MCG/5ML IJ SOLN
INTRAMUSCULAR | Status: AC
  Filled 2017-07-16: qty 5

## 2017-07-16 MED ORDER — LACTATED RINGERS IV SOLN
INTRAVENOUS | Status: DC
  Administered 2017-07-16: 11:00:00 via INTRAVENOUS

## 2017-07-16 MED ORDER — VANCOMYCIN HCL 1000 MG IV SOLR
INTRAVENOUS | Status: AC
  Filled 2017-07-16: qty 1000

## 2017-07-16 MED ORDER — ENOXAPARIN SODIUM 120 MG/0.8ML ~~LOC~~ SOLN
110.0000 mg | Freq: Two times a day (BID) | SUBCUTANEOUS | Status: DC
  Administered 2017-07-17 (×2): 110 mg via SUBCUTANEOUS
  Filled 2017-07-16 (×2): qty 0.73

## 2017-07-16 MED ORDER — OXYCODONE HCL 5 MG PO TABS: 5.0000 mg | ORAL_TABLET | Freq: Once | ORAL | Status: DC | PRN

## 2017-07-16 MED ORDER — LIDOCAINE 2% (20 MG/ML) 5 ML SYRINGE
INTRAMUSCULAR | Status: AC
  Filled 2017-07-16: qty 5

## 2017-07-16 MED ORDER — PHENYLEPHRINE 40 MCG/ML (10ML) SYRINGE FOR IV PUSH (FOR BLOOD PRESSURE SUPPORT)
PREFILLED_SYRINGE | INTRAVENOUS | Status: AC
  Filled 2017-07-16: qty 10

## 2017-07-16 MED ORDER — ARTIFICIAL TEARS OPHTHALMIC OINT
TOPICAL_OINTMENT | OPHTHALMIC | Status: AC
  Filled 2017-07-16: qty 3.5

## 2017-07-16 MED ORDER — VANCOMYCIN HCL 1000 MG IV SOLR
INTRAVENOUS | Status: DC | PRN
  Administered 2017-07-16: 1000 mg via TOPICAL

## 2017-07-16 MED ORDER — BUPIVACAINE HCL (PF) 0.5 % IJ SOLN
INTRAMUSCULAR | Status: AC
  Filled 2017-07-16: qty 30

## 2017-07-16 MED ORDER — ONDANSETRON HCL 4 MG/2ML IJ SOLN
INTRAMUSCULAR | Status: DC | PRN
  Administered 2017-07-16: 4 mg via INTRAVENOUS

## 2017-07-16 MED ORDER — OXYCODONE HCL 5 MG/5ML PO SOLN: 5.0000 mg | Freq: Once | ORAL | Status: DC | PRN

## 2017-07-16 MED ORDER — FENTANYL CITRATE (PF) 100 MCG/2ML IJ SOLN
INTRAMUSCULAR | Status: DC | PRN
  Administered 2017-07-16: 50 ug via INTRAVENOUS
  Administered 2017-07-16 (×2): 25 ug via INTRAVENOUS

## 2017-07-16 MED ORDER — PROPOFOL 10 MG/ML IV BOLUS
INTRAVENOUS | Status: AC
  Filled 2017-07-16: qty 20

## 2017-07-16 MED ORDER — LACTATED RINGERS IV SOLN
INTRAVENOUS | Status: DC
  Administered 2017-07-16: 16:00:00 via INTRAVENOUS

## 2017-07-16 MED ORDER — PROPOFOL 10 MG/ML IV BOLUS
INTRAVENOUS | Status: DC | PRN
  Administered 2017-07-16: 30 mg via INTRAVENOUS
  Administered 2017-07-16: 20 mg via INTRAVENOUS

## 2017-07-16 MED ORDER — FENTANYL CITRATE (PF) 100 MCG/2ML IJ SOLN: 25.0000 ug | INTRAMUSCULAR | Status: DC | PRN

## 2017-07-16 SURGICAL SUPPLY — 46 items
BANDAGE ACE 4X5 VEL STRL LF (GAUZE/BANDAGES/DRESSINGS) ×2 IMPLANT
BNDG ESMARK 4X9 LF (GAUZE/BANDAGES/DRESSINGS) IMPLANT
BNDG GAUZE ELAST 4 BULKY (GAUZE/BANDAGES/DRESSINGS) ×2 IMPLANT
CHLORAPREP W/TINT 26ML (MISCELLANEOUS) ×2 IMPLANT
CONT SPEC 4OZ CLIKSEAL STRL BL (MISCELLANEOUS) ×8 IMPLANT
COVER SURGICAL LIGHT HANDLE (MISCELLANEOUS) IMPLANT
CUFF TOURNIQUET SINGLE 18IN (TOURNIQUET CUFF) IMPLANT
CUFF TOURNIQUET SINGLE 34IN LL (TOURNIQUET CUFF) IMPLANT
DRAPE U-SHAPE 47X51 STRL (DRAPES) ×2 IMPLANT
ELECT CAUTERY BLADE 6.4 (BLADE) ×2 IMPLANT
ELECT REM PT RETURN 9FT ADLT (ELECTROSURGICAL) ×2
ELECTRODE REM PT RTRN 9FT ADLT (ELECTROSURGICAL) ×1 IMPLANT
GAUZE PACKING IODOFORM 1/4X15 (GAUZE/BANDAGES/DRESSINGS) ×2 IMPLANT
GAUZE SPONGE 4X4 12PLY STRL (GAUZE/BANDAGES/DRESSINGS) ×2 IMPLANT
GLOVE BIO SURGEON STRL SZ7.5 (GLOVE) ×2 IMPLANT
GLOVE BIOGEL PI IND STRL 7.5 (GLOVE) ×1 IMPLANT
GLOVE BIOGEL PI IND STRL 8 (GLOVE) ×1 IMPLANT
GLOVE BIOGEL PI INDICATOR 7.5 (GLOVE) ×1
GLOVE BIOGEL PI INDICATOR 8 (GLOVE) ×1
GLOVE SS BIOGEL STRL SZ 7 (GLOVE) ×1 IMPLANT
GLOVE SUPERSENSE BIOGEL SZ 7 (GLOVE) ×1
GOWN STRL REUS W/ TWL LRG LVL3 (GOWN DISPOSABLE) ×1 IMPLANT
GOWN STRL REUS W/ TWL XL LVL3 (GOWN DISPOSABLE) ×1 IMPLANT
GOWN STRL REUS W/TWL LRG LVL3 (GOWN DISPOSABLE) ×1
GOWN STRL REUS W/TWL XL LVL3 (GOWN DISPOSABLE) ×1
KIT BASIN OR (CUSTOM PROCEDURE TRAY) ×2 IMPLANT
KIT ROOM TURNOVER OR (KITS) ×2 IMPLANT
MANIFOLD NEPTUNE II (INSTRUMENTS) IMPLANT
MARKER SKIN DUAL TIP RULER LAB (MISCELLANEOUS) ×2 IMPLANT
NEEDLE BIOPSY JAMSHIDI 8X6 (NEEDLE) IMPLANT
NEEDLE HYPO 25GX1X1/2 BEV (NEEDLE) ×2 IMPLANT
NEEDLE HYPO 25X1 1.5 SAFETY (NEEDLE) ×2 IMPLANT
NS IRRIG 1000ML POUR BTL (IV SOLUTION) IMPLANT
PACK ORTHO EXTREMITY (CUSTOM PROCEDURE TRAY) ×2 IMPLANT
PAD ARMBOARD 7.5X6 YLW CONV (MISCELLANEOUS) ×4 IMPLANT
PROBE DEBRIDE SONICVAC MISONIX (TIP) IMPLANT
SCRUB BETADINE 4OZ XXX (MISCELLANEOUS) IMPLANT
SET CYSTO W/LG BORE CLAMP LF (SET/KITS/TRAYS/PACK) ×2 IMPLANT
SOL PREP POV-IOD 4OZ 10% (MISCELLANEOUS) IMPLANT
SWAB COLLECTION DEVICE MRSA (MISCELLANEOUS) IMPLANT
SWAB CULTURE ESWAB REG 1ML (MISCELLANEOUS) ×2 IMPLANT
SYR CONTROL 10ML LL (SYRINGE) ×4 IMPLANT
TOWEL GREEN STERILE (TOWEL DISPOSABLE) ×2 IMPLANT
TOWEL GREEN STERILE FF (TOWEL DISPOSABLE) ×2 IMPLANT
TUBE CONNECTING 12X1/4 (SUCTIONS) ×2 IMPLANT
YANKAUER SUCT BULB TIP NO VENT (SUCTIONS) ×2 IMPLANT

## 2017-07-16 NOTE — Plan of Care (Signed)
  Clinical Measurements: Ability to maintain clinical measurements within normal limits will improve 07/16/2017 2037 - Progressing by Irish Lack, RN   Education: Knowledge of General Education information will improve 07/16/2017 2037 - Progressing by Irish Lack, RN   Pain Managment: General experience of comfort will improve 07/16/2017 2037 - Progressing by Irish Lack, RN

## 2017-07-16 NOTE — Op Note (Signed)
Patient Name: Jaelyn Cloninger DOB: [birth year]  MRN: [id number]  Date of Service: 07/16/17  Surgeon: Dr. Hardie Pulley, DPM Assistants: None Pre-operative Diagnosis: Left great toe infection Post-operative Diagnosis: Left great toe infection, gouty arthritis left first MPJ Procedures:             1) Debridement of skin and subcutaneous tissue and bone left great toe  2) Arthrotomy medial first MPJ Pathology/Specimens:             1) Joint fluid - micro  2) Joint fluid - path  3) Bone - micro  4) Bone - pathology  5) Soft tissue - micro Anesthesia: MAC/Local Hemostasis: Anatomic Estimated Blood Loss: 5 mls Materials: None Medications: 0.5 g Vancomycin powder Complications: None  Indications for Procedure: This is a 77 year old male with a chief complaint of infection to the left great toe.  States that about a week ago he had an injury to the left great toe and presented to the office with signs of cellulitis and infection of the left great toe.  He underwent bedside irrigation debridement.  Due to the exposed bone discussed the patient would benefit from debridement of the wounds with collection of bone biopsy for tailoring of antibiotic therapy.  All risk benefits alternatives explained no guarantees given.  Procedure in Detail: Patient was identified in pre-operative holding area. Formal consent was signed and the left lower extremity was marked. Patient was brought back to the operating room and placed on the operating room table in the supine position. Anesthesia was induced. The left lower extremity was prepped and draped in the usual sterile fashion. Timeout was taken to confirm patient name, laterality, and procedure prior to incision. Attention was then directed to the left great toe at the lateral aspect where there was a area of previous ulceration that extended down to the bone.  There was purulence noted at the site. The wound was sharply and excisionally debrided with a  rongeur.  Debridement was carried down to level of bone.  The wound post debridement measured approximately 1 cm x 1 cm   Attention was then directed to the medial aspect of the first MPJ where a 15 blade was used to extend the area of previous incision and the incision was carried down to the level of the joint capsule.  Chalky return was noted upon arthrotomy.  This was collected for culture and for pathologic analysis.  All wounds were then copiously irrigated with 1 L normal saline.  Clean rongeurs were used to then collect a deep bone and deep soft tissue specimen about the lateral aspect of the hallux.  These were sent for micro biology.  Additional bone specimen was sent for pathology. The wounds were then gently packed with iodoform packing that had been soaked in vancomycin powder reconstituted in saline.  The foot was then dressed with Betadine soaked 4 x 4, kerlix, and ACE bandage. Patient tolerated the procedure well.  Disposition: Following a period of post-operative monitoring, patient will be transferred back to the floor.

## 2017-07-16 NOTE — Transfer of Care (Signed)
Immediate Anesthesia Transfer of Care Note  Patient: Danford Tat  Procedure(s) Performed: IRRIGATION AND DEBRIDEMENT LEFT FOOT ULCER (Left )  Patient Location: PACU  Anesthesia Type:General  Level of Consciousness: awake, alert , oriented and sedated  Airway & Oxygen Therapy: Patient Spontanous Breathing and Patient connected to nasal cannula oxygen  Post-op Assessment: Report given to RN, Post -op Vital signs reviewed and stable and Patient moving all extremities  Post vital signs: Reviewed and stable  Last Vitals:  Vitals:   07/16/17 0645 07/16/17 1226  BP: (!) 158/80 (!) (P) 132/94  Pulse: 72 (P) 65  Resp: 18 (P) 14  Temp: 36.9 C (P) 36.5 C  SpO2: 98% (P) 95%    Last Pain:  Vitals:   07/16/17 1226  TempSrc:   PainSc: (P) 0-No pain         Complications: No apparent anesthesia complications

## 2017-07-16 NOTE — Progress Notes (Signed)
Hospitalist progress note   Chad Burgess  QGB:[id number] DOB: [birth year] DOA: 07/13/2017 PCP: Maryella Shivers, MD   Specialists:   podiatry Dr. March Rummage  Brief Narrative:  77 year old male, diabetes mellitus type 2, atrial fibrillation chads score >2 on Coumadin, HTN, hyperlipidemia admitted after accidental callus on the left side of his foot secondary to this being caught on a wire had bloody discharge shooting pains and was immediately sent to the ED to rule out deeper infection Podiatry to see  Assessment & Plan:   Assessment:  The primary encounter diagnosis was Diabetic ulcer of toe of left foot associated with diabetes mellitus due to underlying condition, with fat layer exposed ([place]). Diagnoses of Leukocytosis, unspecified type and Diabetic foot ulcer ([place]) were also pertinent to this visit.  Foot wound-ABI done MRI 2/5 = possible osseous edema?  Arthropathy,?  Early osteomyelitis as well as potential partial tear of extensor tendon grade 2-as well as fluid collection-we will continue antibiotics  S/p I and D 2/7 Defer to podiatry wght bering status, abx duration and dispo for therapy--also when to resume coumadin  Diabetes mellitus type 2-continue metformin 1000 twice daily, SSI minimal amounts, sugars 118-126 and reasonably controlled  Atrial fibrillation chads score >2 continue metoprolol 100 daily-continue digoxin 250 mcg daily warfarin is on hold last INR was 1.7 on 2/5 resme Coumadin as per podiatry  HTN continue Dyazide by mouth daily, continue lisinopril 40 twice daily, slightly elevated so resuming clonidine  Hypokalemia-recheck be met in a.m.   DVT prophylaxis: Lovenox code Status:   Full code   family Communication:   Inpatient Disposition Plan: Unclear at present   Consultants:   Podiatry  Procedures:   None  Antimicrobials:   None  Subjective:  Seen in PACU Looks well No distress No pain  Objective: Vitals:   07/15/17 2051 07/16/17 0645  07/16/17 1044 07/16/17 1226  BP: 140/90 (!) 158/80  (!) 132/94  Pulse: 100 72  65  Resp: 18 18  14   Temp: 98.1 F (36.7 C) 98.4 F (36.9 C)  97.7 F (36.5 C)  TempSrc: Oral Oral    SpO2: 97% 98%  95%  Weight:   108.9 kg (240 lb)   Height:   6' 3"  (1.905 m)     Intake/Output Summary (Last 24 hours) at 07/16/2017 1301 Last data filed at 07/16/2017 1232 Gross per 24 hour  Intake 600 ml  Output 5 ml  Net 595 ml   Filed Weights   07/15/17 1022 07/16/17 1044  Weight: 108.9 kg (240 lb) 108.9 kg (240 lb)    Examination:  Awake alert no pallor no ict, EOMI ncat s1 s2 no mrg Chest clear no added sound abd soft nt nd Neuro intact moving 4 limbs equally-no deficit and power 55, reflexes not tested sensory wnl wound not examined today as just post-op  Data Reviewed: I have personally reviewed following labs and imaging studies  CBC: Recent Labs  Lab 07/13/17 1515 07/14/17 0225 07/15/17 0636 07/16/17 0658  WBC 11.1* 8.8 7.1 9.1  NEUTROABS 7.8*  --  4.7 6.5  HGB 14.6 13.7 14.3 14.9  HCT 43.3 40.4 42.3 43.8  MCV 91.5 90.8 90.8 89.4  PLT 217 183 206 975   Basic Metabolic Panel: Recent Labs  Lab 07/13/17 1515 07/14/17 0225 07/15/17 0636 07/16/17 0658  NA 137 138 138 135  K 3.8 3.4* 3.3* 3.6  CL 97* 100* 98* 98*  CO2 27 26 24 25   GLUCOSE 112* 146* 126* 126*  BUN 12 12 10 11   CREATININE 0.78 0.78 0.83 0.80  CALCIUM 9.4 9.2 9.1 9.4  PHOS  --   --  2.9  --    GFR: Estimated Creatinine Clearance: 103.1 mL/min (by C-G formula based on SCr of 0.8 mg/dL). Liver Function Tests: Recent Labs  Lab 07/13/17 1515 07/15/17 0636  AST 29  --   ALT 24  --   ALKPHOS 50  --   BILITOT 0.7  --   PROT 7.0  --   ALBUMIN 3.1* 2.6*   No results for input(s): LIPASE, AMYLASE in the last 168 hours. No results for input(s): AMMONIA in the last 168 hours. Coagulation Profile: Recent Labs  Lab 07/14/17 0225  INR 1.73   Cardiac Enzymes: No results for input(s): CKTOTAL, CKMB,  CKMBINDEX, TROPONINI in the last 168 hours. CBG: Recent Labs  Lab 07/15/17 1137 07/15/17 1632 07/15/17 2058 07/16/17 0657 07/16/17 0957  GLUCAP 145* 114* 125* 126* 118*   Urine analysis: No results found for: COLORURINE, APPEARANCEUR, LABSPEC, PHURINE, GLUCOSEU, HGBUR, BILIRUBINUR, KETONESUR, PROTEINUR, UROBILINOGEN, NITRITE, LEUKOCYTESUR   Radiology Studies: Reviewed images personally in health database    Scheduled Meds: . [MAR Hold] digoxin  250 mcg Oral Daily  . [MAR Hold] enoxaparin (LOVENOX) injection  100 mg Subcutaneous Q12H  . [MAR Hold] fenofibrate  160 mg Oral Daily  . [MAR Hold] insulin aspart  0-15 Units Subcutaneous TID WC  . [MAR Hold] insulin aspart  0-5 Units Subcutaneous QHS  . [MAR Hold] lisinopril  40 mg Oral BID  . [MAR Hold] metFORMIN  1,000 mg Oral BID WC  . [MAR Hold] metoprolol tartrate  100 mg Oral Daily  . [MAR Hold] potassium chloride  20 mEq Oral Daily  . [MAR Hold] triamterene-hydrochlorothiazide  1 capsule Oral Daily   Continuous Infusions: . lactated ringers    . lactated ringers    . [MAR Hold] piperacillin-tazobactam (ZOSYN)  IV 3.375 g (07/16/17 0712)  . [MAR Hold] vancomycin Stopped (07/16/17 0130)     LOS: 3 days    Time spent: [place], MD [facility]   If 7PM-7AM, please contact night-coverage [email or web address] Password [email or web address] 07/16/2017, 1:01 PM

## 2017-07-16 NOTE — Anesthesia Procedure Notes (Signed)
Procedure Name: LMA Insertion Date/Time: 07/16/2017 11:35 AM Performed by: Scheryl Darter, CRNA Pre-anesthesia Checklist: Patient identified, Emergency Drugs available, Suction available and Patient being monitored Patient Re-evaluated:Patient Re-evaluated prior to induction Oxygen Delivery Method: Circle System Utilized Preoxygenation: Pre-oxygenation with 100% oxygen Induction Type: IV induction Ventilation: Mask ventilation without difficulty LMA: LMA inserted LMA Size: 5.0 Number of attempts: 1 Placement Confirmation: positive ETCO2 Tube secured with: Tape Dental Injury: Teeth and Oropharynx as per pre-operative assessment

## 2017-07-16 NOTE — Interval H&P Note (Signed)
History and Physical Interval Note:  07/16/2017 11:00 AM  Chad Burgess  has presented today for surgery, with the diagnosis of ulcer  The various methods of treatment have been discussed with the patient and family. After consideration of risks, benefits and other options for treatment, the patient has consented to  Procedure(s): IRRIGATION AND DEBRIDEMENT LEFT FOOT ULCER (Left) as a surgical intervention .  The patient's history has been reviewed, patient examined, no change in status, stable for surgery.  I have reviewed the patient's chart and labs.  Questions were answered to the patient's satisfaction.     Evelina Bucy

## 2017-07-16 NOTE — Anesthesia Preprocedure Evaluation (Addendum)
Anesthesia Evaluation  Patient identified by MRN, date of birth, ID band Patient awake    Reviewed: Allergy & Precautions, NPO status , Patient's Chart, lab work & pertinent test results  Airway Mallampati: II  TM Distance: >3 FB Neck ROM: Full    Dental  (+) Teeth Intact, Dental Advisory Given   Pulmonary former smoker,    breath sounds clear to auscultation       Cardiovascular hypertension,  Rhythm:Irregular Rate:Normal     Neuro/Psych    GI/Hepatic   Endo/Other  diabetes  Renal/GU      Musculoskeletal   Abdominal   Peds  Hematology   Anesthesia Other Findings   Reproductive/Obstetrics                            Anesthesia Physical Anesthesia Plan  ASA: III  Anesthesia Plan: MAC   Post-op Pain Management:    Induction: Intravenous  PONV Risk Score and Plan: Ondansetron  Airway Management Planned: Natural Airway and Simple Face Mask  Additional Equipment:   Intra-op Plan:   Post-operative Plan:   Informed Consent: I have reviewed the patients History and Physical, chart, labs and discussed the procedure including the risks, benefits and alternatives for the proposed anesthesia with the patient or authorized representative who has indicated his/her understanding and acceptance.   Dental advisory given  Plan Discussed with: CRNA and Anesthesiologist  Anesthesia Plan Comments:         Anesthesia Quick Evaluation

## 2017-07-16 NOTE — Anesthesia Postprocedure Evaluation (Signed)
Anesthesia Post Note  Patient: Chad Burgess  Procedure(s) Performed: IRRIGATION AND DEBRIDEMENT LEFT FOOT ULCER (Left )     Patient location during evaluation: PACU Anesthesia Type: General Level of consciousness: awake and alert Pain management: pain level controlled Vital Signs Assessment: post-procedure vital signs reviewed and stable Respiratory status: spontaneous breathing, nonlabored ventilation, respiratory function stable and patient connected to nasal cannula oxygen Cardiovascular status: blood pressure returned to baseline and stable Postop Assessment: no apparent nausea or vomiting Anesthetic complications: no    Last Vitals:  Vitals:   07/16/17 1330 07/16/17 1403  BP: 128/88   Pulse: 81   Resp: 10 20  Temp:  36.9 C  SpO2: 94%     Last Pain:  Vitals:   07/16/17 1403  TempSrc: Oral  PainSc:                  Najwa Spillane COKER

## 2017-07-16 NOTE — Brief Op Note (Signed)
07/16/2017  12:14 PM  PATIENT:  Chad Burgess  77 y.o. male  PRE-OPERATIVE DIAGNOSIS:  ulcer  POST-OPERATIVE DIAGNOSIS:  * No post-op diagnosis entered *  PROCEDURE:  Procedure(s): IRRIGATION AND DEBRIDEMENT LEFT FOOT ULCER (Left)  SURGEON:  Surgeon(s) and Role:    * Evelina Bucy, DPM - Primary  PHYSICIAN ASSISTANT:   ASSISTANTS: None   ANESTHESIA:   local and MAC  EBL:  Minimal   BLOOD ADMINISTERED:none  DRAINS: none   LOCAL MEDICATIONS USED:  MARCAINE    and Amount: 10 ml  SPECIMEN:  Source of Specimen:  Fluid, soft tissue, and bone  DISPOSITION OF SPECIMEN:  Pathology, micro  COUNTS:  YES  TOURNIQUET:  * No tourniquets in log *  DICTATION: .Note written in EPIC  PLAN OF CARE: Transfer to floor  PATIENT DISPOSITION:  PACU - hemodynamically stable.   Delay start of Pharmacological VTE agent (>24hrs) due to surgical blood loss or risk of bleeding: not applicable
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
## 2017-07-17 ENCOUNTER — Encounter (HOSPITAL_COMMUNITY): Payer: Self-pay | Admitting: Podiatry

## 2017-07-17 ENCOUNTER — Inpatient Hospital Stay (HOSPITAL_COMMUNITY): Payer: Medicare HMO

## 2017-07-17 ENCOUNTER — Telehealth: Payer: Self-pay | Admitting: Podiatry

## 2017-07-17 LAB — GLUCOSE, CAPILLARY
GLUCOSE-CAPILLARY: 148 mg/dL — AB (ref 65–99)
Glucose-Capillary: 130 mg/dL — ABNORMAL HIGH (ref 65–99)

## 2017-07-17 MED ORDER — HEPARIN SOD (PORK) LOCK FLUSH 100 UNIT/ML IV SOLN
250.0000 [IU] | INTRAVENOUS | Status: AC | PRN
  Administered 2017-07-17: 250 [IU]

## 2017-07-17 MED ORDER — SODIUM CHLORIDE 0.9 % IV SOLN
1.0000 g | Freq: Once | INTRAVENOUS | Status: AC
  Administered 2017-07-17: 1 g via INTRAVENOUS
  Filled 2017-07-17: qty 1

## 2017-07-17 MED ORDER — OXYCODONE-ACETAMINOPHEN 7.5-325 MG PO TABS: 1.0000 | ORAL_TABLET | ORAL | 0 refills | Status: AC | PRN

## 2017-07-17 MED ORDER — SODIUM CHLORIDE 0.9% FLUSH: 10.0000 mL | INTRAVENOUS | Status: DC | PRN

## 2017-07-17 MED ORDER — SODIUM CHLORIDE 0.9 % IV SOLN: 1.0000 g | INTRAVENOUS | 0 refills | Status: DC

## 2017-07-17 NOTE — Telephone Encounter (Signed)
This Stanton Kidney, Nurse in the pharmacy with [facility] in [facility]. Mr. Karner is going to be starting IV med's tomorrow for 7 days and I was just checking to see if Dr. March Rummage wanted the home health nurse to do any labs while he is on the IV. Our call back number is [phone]. Again we are just checking to see if any labs need to be taken by the home health nurse while he is on these iv medicines at home. Thank you.

## 2017-07-17 NOTE — Care Management Important Message (Signed)
Important Message  Patient Details  Name: Chad Burgess MRN: [id number] Date of Birth: 06-Oct-1940   Medicare Important Message Given:  Yes    Orbie Pyo 07/17/2017, 12:34 PM

## 2017-07-17 NOTE — Discharge Summary (Signed)
Physician Discharge Summary  Chad Burgess GYI:[id number] DOB: 1940/10/20 DOA: 07/13/2017  PCP: Maryella Shivers, MD  Admit date: 07/13/2017 Discharge date: 07/17/2017  Time spent: 25 minutes  Recommendations for Outpatient Follow-up:  1. Will need to have antibiotics narrowed from Invanz to possibly ceftriaxone depending on cultures and should be followed up by Dr. March Rummage as an outpatient 2. PICC line will need to be removed after 6 weeks duration of antibiotic therapy 3. Recommend getting ESR CRP CBC plus differential weekly reported to Dr. March Rummage for follow-up  Discharge Diagnoses:  Principal Problem:   Diabetic foot ulcer ([place]) Active Problems:   Diabetes mellitus ([place])   HTN (hypertension)   Dyslipidemia   Discharge Condition: Improved  Diet recommendation: Diabetic heart healthy  Filed Weights   07/15/17 1022 07/16/17 1044  Weight: 108.9 kg (240 lb) 108.9 kg (240 lb)    History of present illness:  77 year old male, diabetes mellitus type 2, atrial fibrillation chads score >2 on Coumadin, HTN, hyperlipidemia admitted after accidental callus on the left side of his foot secondary to this being caught on a wire had bloody discharge shooting pains and was immediately sent to the ED to rule out deeper infection Podiatry saw the patient and performed incision and drainage on 2/7  Hospital Course:   Foot wound-ABI done MRI 2/5 = possible osseous edema?  Arthropathy,?  Early osteomyelitis as well as potential partial tear of extensor tendon grade 2-as well as fluid collection-we will continue antibiotics  S/p I and D 2/7 Resumed on Coumadin weightbearing status as per podiatrist and antibiotic duration as above I discussed with Dr. Megan Salon the case and patient was very insistent to go home despite cultures not being available-we agreed that Chad Burgess was a reasonable alternative for short period of time and to prevent resistance that should be narrowed quickly to possible  ceftriaxone on discharge  Diabetes mellitus type 2-continue metformin 1000 twice daily, SSI minimal amounts, sugars reasonably controlled between 130 and 161  Atrial fibrillation chads score >2 continue metoprolol 100 daily-continue digoxin 250 mcg daily warfarin is on hold last INR was 1.7 on 2/5 Coumadin was resumed on discharge and it was felt he did not need bridging  HTN continue Dyazide by mouth daily, continue lisinopril 40 twice daily, slightly elevated so resuming clonidine  Hypokalemia-normalized during hospital stay    Procedures:  I&D by podiatry on 2/7  Consultations:  Podiatry Dr. March Rummage  Discharge Exam: Vitals:   07/16/17 2156 07/17/17 0446  BP: (!) 153/67 (!) 160/60  Pulse:  65  Resp:  16  Temp:  97.8 F (36.6 C)  SpO2:  97%    General: Awake alert wants to go home Cardiovascular: S1-S2 no murmur Respiratory: Clinically clear no added sounds Leg is wrapped he is now having distress or pain  Discharge Instructions   Discharge Instructions    Diet - low sodium heart healthy   Complete by:  As directed    Increase activity slowly   Complete by:  As directed      Allergies as of 07/17/2017   No Known Allergies     Medication List    TAKE these medications   cloNIDine 0.2 MG tablet Commonly known as:  CATAPRES TAKE 1 TABLET (0.29m) BY MOUTH EVERYDAY AT BEDTIME   digoxin 0.25 MG tablet Commonly known as:  LANOXIN Take 250 mcg by mouth daily.   ertapenem 1 g in sodium chloride 0.9 % 50 mL Inject 1 g into the vein daily.  fenofibrate 160 MG tablet Take 160 mg by mouth daily.   lisinopril 40 MG tablet Commonly known as:  PRINIVIL,ZESTRIL Take 40 mg by mouth 2 (two) times daily.   metFORMIN 1000 MG tablet Commonly known as:  GLUCOPHAGE Take 1,000 mg by mouth 2 (two) times daily.   metoprolol tartrate 100 MG tablet Commonly known as:  LOPRESSOR Take 1 tablet (178m) by mouth once daily.   triamterene-hydrochlorothiazide 37.5-25 MG  capsule Commonly known as:  DYAZIDE Take 1 capsule by mouth daily.   warfarin 10 MG tablet Commonly known as:  COUMADIN Take 167mby mouth four times weekly or as directed.      No Known Allergies    The results of significant diagnostics from this hospitalization (including imaging, microbiology, ancillary and laboratory) are listed below for reference.    Significant Diagnostic Studies:  Microbiology: Recent Results (from the past 240 hour(s))  Anaerobic culture     Status: None (Preliminary result)   Collection Time: 07/16/17 11:51 AM  Result Value Ref Range Status   Specimen Description SYNOVIAL LEFT TOE GREAT  Final   Special Requests ONLY ANA SWAB SENT POF ZOSYN  Final   Gram Stain   Final    RARE WBC PRESENT, PREDOMINANTLY PMN NO ORGANISMS SEEN Performed at [facility] [place] [id number]  Culture PENDING  Incomplete   Report Status PENDING  Incomplete  Aerobic/Anaerobic Culture (surgical/deep wound)     Status: None (Preliminary result)   Collection Time: 07/16/17 12:00 PM  Result Value Ref Range Status   Specimen Description TISSUE TOE GREAT TOE SPEC C  Final   Special Requests PATIENT ON FOLLOWING ZOSYN  Final   Gram Stain   Final    NO WBC SEEN RARE GRAM POSITIVE COCCI IN PAIRS AND CHAINS Performed at [facility] [place] [address]  Culture PENDING  Incomplete   Report Status PENDING  Incomplete  Aerobic/Anaerobic Culture (surgical/deep wound)     Status: None (Preliminary result)   Collection Time: 07/16/17 12:04 PM  Result Value Ref Range Status   Specimen Description BONE LEFT TOE GREAT TOE SPEC D  Final   Special Requests PATIENT ON FOLLOWING ZOSYN  Final   Gram Stain   Final    RARE WBC PRESENT, PREDOMINANTLY PMN RARE GRAM POSITIVE COCCI IN PAIRS [facility]formed at [facility] [place] [address]  Culture PENDING  Incomplete   Report Status PENDING   Incomplete     Labs: Basic Metabolic Panel: Recent Labs  Lab 07/13/17 1515 07/14/17 0225 07/15/17 0636 07/16/17 0658  NA 137 138 138 135  K 3.8 3.4* 3.3* 3.6  CL 97* 100* 98* 98*  CO2 27 26 24 25   GLUCOSE 112* 146* 126* 126*  BUN 12 12 10 11   CREATININE 0.78 0.78 0.83 0.80  CALCIUM 9.4 9.2 9.1 9.4  PHOS  --   --  2.9  --    Liver Function Tests: Recent Labs  Lab 07/13/17 1515 07/15/17 0636  AST 29  --   ALT 24  --   ALKPHOS 50  --   BILITOT 0.7  --   PROT 7.0  --   ALBUMIN 3.1* 2.6*   No results for input(s): LIPASE, AMYLASE in the last 168 hours. No results for input(s): AMMONIA in the last 168 hours. CBC: Recent Labs  Lab 07/13/17 1515 07/14/17 0225 07/15/17 0636  07/16/17 0658  WBC 11.1* 8.8 7.1 9.1  NEUTROABS 7.8*  --  4.7 6.5  HGB 14.6 13.7 14.3 14.9  HCT 43.3 40.4 42.3 43.8  MCV 91.5 90.8 90.8 89.4  PLT 217 183 206 232   Cardiac Enzymes: No results for input(s): CKTOTAL, CKMB, CKMBINDEX, TROPONINI in the last 168 hours. BNP: BNP (last 3 results) No results for input(s): BNP in the last 8760 hours.  ProBNP (last 3 results) No results for input(s): PROBNP in the last 8760 hours.  CBG: Recent Labs  Lab 07/16/17 0957 07/16/17 1330 07/16/17 1752 07/16/17 2153 07/17/17 0611  GLUCAP 118* 112* 183* 161* 130*       Signed:  Nita Sells MD   [facility] 07/17/2017, 9:45 AM

## 2017-07-17 NOTE — Progress Notes (Signed)
Pt given discharge instructions and prescriptions. Instructions gone over with him, wife and daughter. Answered all questions to satisfaction. All belongings gathered to be sent home with pt. Pt in no distress at time of discharge.

## 2017-07-17 NOTE — Progress Notes (Signed)
RN changed left great toe bandage per MD orders. Pt tolerated well.

## 2017-07-17 NOTE — Progress Notes (Signed)
Subjective:  Patient ID: Chad Burgess, male    DOB: 25-Oct-1940,  MRN: [id number]  Patient seen at bedside. Reports pain earlier today from his L great toe. Otherwise denies complaints. Objective:   Vitals:   07/16/17 2156 07/17/17 0446  BP: (!) 153/67 (!) 160/60  Pulse:  65  Resp:  16  Temp:  97.8 F (36.6 C)  SpO2:  97%   General AA&O x3. Normal mood and affect.  Vascular Foot warm to touch.  Neurologic Epicritic sensation grossly intact.  Dermatologic L great toe without continued purulence. No erythema. SS drainage only. No local warmth or erythema. No ascending cellulitis.  Orthopedic: Pain to palpation L great toe.   Results for orders placed or performed during the hospital encounter of 07/13/17 (from the past 24 hour(s))  Glucose, capillary     Status: Abnormal   Collection Time: 07/16/17  9:57 AM  Result Value Ref Range   Glucose-Capillary 118 (H) 65 - 99 mg/dL  Anaerobic culture     Status: None (Preliminary result)   Collection Time: 07/16/17 11:51 AM  Result Value Ref Range   Specimen Description SYNOVIAL LEFT TOE GREAT    Special Requests ONLY ANA SWAB SENT POF ZOSYN    Gram Stain      RARE WBC PRESENT, PREDOMINANTLY PMN NO ORGANISMS SEEN Performed at [facility], [place] [address], [place], [place] [address]    Culture PENDING    Report Status PENDING   Synovial fluid, crystal     Status: None   Collection Time: 07/16/17 11:53 AM  Result Value Ref Range   Crystals, Fluid EXTRACELLULAR MONOSODIUM URATE CRYSTALS   Aerobic/Anaerobic Culture (surgical/deep wound)     Status: None (Preliminary result)   Collection Time: 07/16/17 12:00 PM  Result Value Ref Range   Specimen Description TISSUE TOE GREAT TOE SPEC C    Special Requests PATIENT ON FOLLOWING ZOSYN    Gram Stain      NO WBC SEEN RARE GRAM POSITIVE COCCI IN PAIRS AND CHAINS Performed at [facility], [place] [address] [place], [place] [address]    Culture PENDING    Report Status  PENDING   Aerobic/Anaerobic Culture (surgical/deep wound)     Status: None (Preliminary result)   Collection Time: 07/16/17 12:04 PM  Result Value Ref Range   Specimen Description BONE LEFT TOE GREAT TOE SPEC D    Special Requests PATIENT ON FOLLOWING ZOSYN    Gram Stain      RARE WBC PRESENT, PREDOMINANTLY PMN RARE GRAM POSITIVE COCCI IN PAIRS Performed at [facility], [place] [address], [place], [place] [address]    Culture PENDING    Report Status PENDING   Glucose, capillary     Status: Abnormal   Collection Time: 07/16/17  1:30 PM  Result Value Ref Range   Glucose-Capillary 112 (H) 65 - 99 mg/dL  Glucose, capillary     Status: Abnormal   Collection Time: 07/16/17  5:52 PM  Result Value Ref Range   Glucose-Capillary 183 (H) 65 - 99 mg/dL  Glucose, capillary     Status: Abnormal   Collection Time: 07/16/17  9:53 PM  Result Value Ref Range   Glucose-Capillary 161 (H) 65 - 99 mg/dL  Glucose, capillary     Status: Abnormal   Collection Time: 07/17/17  6:11 AM  Result Value Ref Range   Glucose-Capillary 130 (H) 65 - 99 mg/dL    Assessment & Plan:  Patient was evaluated  and treated and all questions answered.  L Great Toe Infection - Cultures taken yesterday pending. - Bone culture GS +GPC in pairs. Culture pending. Recommend 6w IV abx with PICC line for treatment of osteomyelitis. -ABIs reviewed. No vascular intervention indicated at this time. - Recommend HHC for IV abx and for wound care. Can discharge with broad spectrum and tailor abx as outpatient depending on culture results. Patient will need qOD packing of his wounds with aquacell Ag. -Order placed for nursing to pack today.  Patient ok for discharge today from podiatry standpoint after PICC placement. Please have patient f/u in the office this coming Monday. Patient is seen at the [facility] office.

## 2017-07-17 NOTE — Care Management Note (Signed)
Case Management Note  Patient Details  Name: Chad Burgess MRN: [id number] Date of Birth: Nov 16, 1940  Subjective/Objective:      Diabetic foot ulcer, DM, HTN              Action/Plan: Spoke to pt and wife at bedside. Offered choice for HH/list provided. Wife agreeable to [facility] for [facility] for dc home with IV abx. He has RW, and shower chair at home.   PCP Maryella Shivers MD  Expected Discharge Date:  07/17/17               Expected Discharge Plan:  [place]  In-House Referral:  NA  Discharge planning Services  CM Consult  Post Acute Care Choice:  Home Health Choice offered to:  Patient, Spouse  DME Arranged:  N/A DME Agency:  NA  HH Arranged:  RN, IV Antibiotics HH Agency:  [place]  Status of Service:  Completed, signed off  If discussed at [place] of Stay Meetings, dates discussed:    Additional Comments:  Erenest Rasher, RN 07/17/2017, 12:33 PM

## 2017-07-17 NOTE — Progress Notes (Signed)
Peripherally Inserted Central Catheter/Midline Placement  The IV Nurse has discussed with the patient and/or persons authorized to consent for the patient, the purpose of this procedure and the potential benefits and risks involved with this procedure.  The benefits include less needle sticks, lab draws from the catheter, and the patient may be discharged home with the catheter. Risks include, but not limited to, infection, bleeding, blood clot (thrombus formation), and puncture of an artery; nerve damage and irregular heartbeat and possibility to perform a PICC exchange if needed/ordered by physician.  Alternatives to this procedure were also discussed.  Bard Power PICC patient education guide, fact sheet on infection prevention and patient information card has been provided to patient /or left at bedside.    PICC/Midline Placement Documentation  PICC Single Lumen 64/38/37 PICC Right Basilic 44 cm 0 cm (Active)  Indication for Insertion or Continuance of Line Home intravenous therapies (PICC only) 07/17/2017 11:00 AM  Exposed Catheter (cm) 0 cm 07/17/2017 11:00 AM  Site Assessment Clean;Dry;Intact 07/17/2017 11:00 AM  Line Status Flushed;Blood return noted 07/17/2017 11:00 AM  Dressing Type Transparent 07/17/2017 11:00 AM  Dressing Status Clean;Dry;Intact;Antimicrobial disc in place 07/17/2017 11:00 AM  Dressing Change Due 07/24/17 07/17/2017 11:00 AM       Jule Economy Horton 07/17/2017, 12:21 PM

## 2017-07-17 NOTE — Progress Notes (Signed)
[facility] will provide Home Infusion Pharmacy services at DC to support IV ABX at home for pt.  If patient discharges after hours, please call [phone].   Larry Sierras 07/17/2017, 11:09 AM

## 2017-07-18 DIAGNOSIS — L03032 Cellulitis of left toe: Secondary | ICD-10-CM | POA: Diagnosis not present

## 2017-07-18 DIAGNOSIS — I10 Essential (primary) hypertension: Secondary | ICD-10-CM | POA: Diagnosis not present

## 2017-07-18 DIAGNOSIS — S91122A Laceration with foreign body of left great toe without damage to nail, initial encounter: Secondary | ICD-10-CM | POA: Diagnosis not present

## 2017-07-18 DIAGNOSIS — E119 Type 2 diabetes mellitus without complications: Secondary | ICD-10-CM | POA: Diagnosis not present

## 2017-07-18 DIAGNOSIS — M86172 Other acute osteomyelitis, left ankle and foot: Secondary | ICD-10-CM | POA: Diagnosis not present

## 2017-07-18 DIAGNOSIS — Z452 Encounter for adjustment and management of vascular access device: Secondary | ICD-10-CM | POA: Diagnosis not present

## 2017-07-18 LAB — FUNGAL STAIN REFLEX

## 2017-07-18 LAB — FUNGUS STAIN

## 2017-07-20 ENCOUNTER — Ambulatory Visit: Payer: Medicare HMO | Admitting: Podiatry

## 2017-07-20 ENCOUNTER — Telehealth: Payer: Self-pay | Admitting: *Deleted

## 2017-07-20 DIAGNOSIS — M109 Gout, unspecified: Secondary | ICD-10-CM

## 2017-07-20 DIAGNOSIS — L03119 Cellulitis of unspecified part of limb: Secondary | ICD-10-CM

## 2017-07-20 DIAGNOSIS — L02619 Cutaneous abscess of unspecified foot: Secondary | ICD-10-CM

## 2017-07-20 DIAGNOSIS — L97521 Non-pressure chronic ulcer of other part of left foot limited to breakdown of skin: Secondary | ICD-10-CM

## 2017-07-20 MED ORDER — DEXTROSE 5 % IV SOLN: 1.0000 g | INTRAVENOUS | 41 refills | Status: AC

## 2017-07-20 NOTE — Telephone Encounter (Signed)
Faxed orders, clinicals and demographics to [facility] Special Procedure Room.

## 2017-07-20 NOTE — Telephone Encounter (Signed)
Orders of 07/20/2017 9:23am Dr. March Rummage faxed to [facility] [phone].

## 2017-07-20 NOTE — Telephone Encounter (Signed)
I informed Volney Presser the Vancomycin could be administered today.

## 2017-07-20 NOTE — Telephone Encounter (Signed)
-----   Message from Evelina Bucy, DPM sent at 07/20/2017  9:21 AM EST ----- Regarding: [place]  Can we send updated orders to [facility]? Patient states they have advanced [place].  See abx recommendations. D/c Vancomycin. Do not need to perform labs as per your previous message given we are switching to Ceftriaxone.  Wound care as per note.  Clinicals done :)

## 2017-07-20 NOTE — Telephone Encounter (Signed)
[facility] Special Procedure Room states send rx with orders, dx and stating following doses will be given by [facility] daily, and scheduled pt for 11:00am 07/21/2017.

## 2017-07-20 NOTE — Progress Notes (Signed)
  Subjective:  Patient ID: Chad Burgess, male    DOB: 1940/12/23,  MRN: [id number]  Chief Complaint  Patient presents with  . Foot Ulcer    fu left 1st toe infection and hospital stay seems to be doing better     DOS: 07/16/17 Procedure: L Great Toe Debridement and Irrigation,   77 y.o. male returns for post-op check. Denies N/V/F/Ch. Having a little bit of pain. States HHC is coming for dressing changes and for abx therapy.  Objective:   General AA&O x3. Normal mood and affect.  Vascular Foot warm and well perfused.  Neurologic Gross sensation diminished.  Dermatologic Wounds without continued purulence. Still chalky exudate medial 1st MPJ.  Orthopedic: Tenderness to palpation noted about the surgical site.    Assessment & Plan:  Patient was evaluated and treated and all questions answered.  S/p L Great Toe D&I, Arthrotomy -Medications refilled: none -Wound care by [facility] - daily packing with iodoform packing to the lateral hallux, medial 1st MPJ. -Abx by HHC - 1g Ceftriaxone daily x 6 weeks. -Wounds cleansed. Foot redressed with betadine DSD.  Gout 1st MPJ -Educated on diet modifications.  Return in about 1 week (around 07/27/2017) for post-op.

## 2017-07-20 NOTE — Telephone Encounter (Signed)
Dr. March Rummage messaged 07/20/2017 to D/C Vancomycin. See Telephone Call 07/20/2017.

## 2017-07-20 NOTE — Telephone Encounter (Signed)
I spoke with pt he states he doesn't know if he has ever had rocephin. I spoke with pt's wife Volney Presser at pt's request and informed the 1st dose of rocephin would need to be given at the Special Procedure Room at [facility]. Volney Presser states they have other appts tomorrow and sited her hair appt tomorrow that had been changed from 8:00am to 6:00pm. I told her I would try to schedule between those hours.

## 2017-07-20 NOTE — Telephone Encounter (Signed)
I informed Chad Burgess, of the Special Procedure Room appt 07/21/2017 at 11:00am, and she asked if pt should take the last dose of Vancomycin today. I told her I would check with Special Procedure.

## 2017-07-20 NOTE — Telephone Encounter (Signed)
Left message for Special Procedures Room staff to contact our office to assist in scheduling 1st rocephin dosing.

## 2017-07-20 NOTE — Telephone Encounter (Signed)
Special Procedure - Chad Burgess states can take the Vancomycin today will not effect the rocephin administration tomorrow.

## 2017-07-20 NOTE — Telephone Encounter (Signed)
I informed [place] of Dr. Eleanora Neighbor orders to D/C Vancomycin and to begin 1gr Rocephin IVPB daily for 6 weeks. Stanton Kidney states the 1st dose of the Rocephin must be given at the hospital if not had before.

## 2017-07-21 DIAGNOSIS — L02619 Cutaneous abscess of unspecified foot: Secondary | ICD-10-CM | POA: Diagnosis not present

## 2017-07-21 DIAGNOSIS — L97521 Non-pressure chronic ulcer of other part of left foot limited to breakdown of skin: Secondary | ICD-10-CM | POA: Diagnosis not present

## 2017-07-21 LAB — ANAEROBIC CULTURE

## 2017-07-21 LAB — AEROBIC/ANAEROBIC CULTURE (SURGICAL/DEEP WOUND)

## 2017-07-21 LAB — AEROBIC/ANAEROBIC CULTURE W GRAM STAIN (SURGICAL/DEEP WOUND): Gram Stain: NONE SEEN

## 2017-07-22 DIAGNOSIS — S91122A Laceration with foreign body of left great toe without damage to nail, initial encounter: Secondary | ICD-10-CM | POA: Diagnosis not present

## 2017-07-22 DIAGNOSIS — I10 Essential (primary) hypertension: Secondary | ICD-10-CM | POA: Diagnosis not present

## 2017-07-22 DIAGNOSIS — E11621 Type 2 diabetes mellitus with foot ulcer: Secondary | ICD-10-CM | POA: Diagnosis not present

## 2017-07-22 DIAGNOSIS — M86172 Other acute osteomyelitis, left ankle and foot: Secondary | ICD-10-CM | POA: Diagnosis not present

## 2017-07-22 DIAGNOSIS — E1140 Type 2 diabetes mellitus with diabetic neuropathy, unspecified: Secondary | ICD-10-CM | POA: Diagnosis not present

## 2017-07-22 DIAGNOSIS — E1169 Type 2 diabetes mellitus with other specified complication: Secondary | ICD-10-CM | POA: Diagnosis not present

## 2017-07-22 DIAGNOSIS — Z452 Encounter for adjustment and management of vascular access device: Secondary | ICD-10-CM | POA: Diagnosis not present

## 2017-07-22 DIAGNOSIS — L03032 Cellulitis of left toe: Secondary | ICD-10-CM | POA: Diagnosis not present

## 2017-07-22 DIAGNOSIS — E119 Type 2 diabetes mellitus without complications: Secondary | ICD-10-CM | POA: Diagnosis not present

## 2017-07-24 DIAGNOSIS — L03032 Cellulitis of left toe: Secondary | ICD-10-CM | POA: Diagnosis not present

## 2017-07-24 DIAGNOSIS — I10 Essential (primary) hypertension: Secondary | ICD-10-CM | POA: Diagnosis not present

## 2017-07-24 DIAGNOSIS — M86172 Other acute osteomyelitis, left ankle and foot: Secondary | ICD-10-CM | POA: Diagnosis not present

## 2017-07-24 DIAGNOSIS — E119 Type 2 diabetes mellitus without complications: Secondary | ICD-10-CM | POA: Diagnosis not present

## 2017-07-24 DIAGNOSIS — Z452 Encounter for adjustment and management of vascular access device: Secondary | ICD-10-CM | POA: Diagnosis not present

## 2017-07-24 DIAGNOSIS — S91122A Laceration with foreign body of left great toe without damage to nail, initial encounter: Secondary | ICD-10-CM | POA: Diagnosis not present

## 2017-07-27 ENCOUNTER — Ambulatory Visit: Payer: Medicare HMO | Admitting: Podiatry

## 2017-07-27 DIAGNOSIS — L02619 Cutaneous abscess of unspecified foot: Secondary | ICD-10-CM | POA: Diagnosis not present

## 2017-07-27 DIAGNOSIS — L97521 Non-pressure chronic ulcer of other part of left foot limited to breakdown of skin: Secondary | ICD-10-CM | POA: Diagnosis not present

## 2017-07-27 DIAGNOSIS — Z5181 Encounter for therapeutic drug level monitoring: Secondary | ICD-10-CM | POA: Diagnosis not present

## 2017-07-27 DIAGNOSIS — Z7901 Long term (current) use of anticoagulants: Secondary | ICD-10-CM | POA: Diagnosis not present

## 2017-07-27 DIAGNOSIS — L03119 Cellulitis of unspecified part of limb: Secondary | ICD-10-CM

## 2017-07-27 DIAGNOSIS — M109 Gout, unspecified: Secondary | ICD-10-CM

## 2017-07-27 DIAGNOSIS — Z789 Other specified health status: Secondary | ICD-10-CM | POA: Diagnosis not present

## 2017-07-27 DIAGNOSIS — I482 Chronic atrial fibrillation: Secondary | ICD-10-CM | POA: Diagnosis not present

## 2017-07-27 MED ORDER — COLCHICINE 0.6 MG PO TABS: 0.6000 mg | ORAL_TABLET | Freq: Every day | ORAL | 0 refills | Status: DC

## 2017-07-27 NOTE — Progress Notes (Signed)
  Subjective:  Patient ID: Chad Burgess, male    DOB: July 07, 1940,  MRN: [id number]  Chief Complaint  Patient presents with  . Wound Check    fu left 1st toe still some drainage and tender along the insicion site concerend that it is still so swollen and red    77 y.o. male returns for wound care. Believes the wound to be about the same. Still reporting tenderness at the inside of the great toe. Denies N/V/F/Ch.  Objective:  There were no vitals filed for this visit. General AA&O x3. Normal mood and affect.  Vascular Foot warm to touch.  Neurologic Sensation grossly diminished.  Dermatologic (Wound) Wound Location: L Hallux IPJ Laterally Wound Measurement: 0.3 x 0.3 x 0.3 with PTB Wound Base: Granular/Healthy Peri-wound: Macerated Exudate: None: wound tissue dry  Chalky exudate medial 1st MPJ.  Orthopedic: Pain on palpation medial 1st MPJ L No pain on palpation L great toe about the ulceration   Assessment & Plan:  Patient was evaluated and treated and all questions answered.  S/p L Great Toe D&I, Arthrotomy -Wounds cleansed. Gentle manual debridement with dry gauze. -Dressed with betadine, DSD. -Continue packing by [place]. -Continue Ceftriaxone by HHC.  Gout -Add on Colchicine -Discussed continued diet precautions  Return in about 1 week (around 08/03/2017).

## 2017-07-29 DIAGNOSIS — I10 Essential (primary) hypertension: Secondary | ICD-10-CM | POA: Diagnosis not present

## 2017-07-29 DIAGNOSIS — E785 Hyperlipidemia, unspecified: Secondary | ICD-10-CM | POA: Diagnosis not present

## 2017-07-29 DIAGNOSIS — M86172 Other acute osteomyelitis, left ankle and foot: Secondary | ICD-10-CM | POA: Diagnosis not present

## 2017-07-29 DIAGNOSIS — E1140 Type 2 diabetes mellitus with diabetic neuropathy, unspecified: Secondary | ICD-10-CM | POA: Diagnosis not present

## 2017-07-29 DIAGNOSIS — E1169 Type 2 diabetes mellitus with other specified complication: Secondary | ICD-10-CM | POA: Diagnosis not present

## 2017-07-29 DIAGNOSIS — S91122A Laceration with foreign body of left great toe without damage to nail, initial encounter: Secondary | ICD-10-CM | POA: Diagnosis not present

## 2017-07-31 DIAGNOSIS — I10 Essential (primary) hypertension: Secondary | ICD-10-CM | POA: Diagnosis not present

## 2017-07-31 DIAGNOSIS — E119 Type 2 diabetes mellitus without complications: Secondary | ICD-10-CM | POA: Diagnosis not present

## 2017-07-31 DIAGNOSIS — L03032 Cellulitis of left toe: Secondary | ICD-10-CM | POA: Diagnosis not present

## 2017-07-31 DIAGNOSIS — Z452 Encounter for adjustment and management of vascular access device: Secondary | ICD-10-CM | POA: Diagnosis not present

## 2017-07-31 DIAGNOSIS — S91122A Laceration with foreign body of left great toe without damage to nail, initial encounter: Secondary | ICD-10-CM | POA: Diagnosis not present

## 2017-07-31 DIAGNOSIS — M86172 Other acute osteomyelitis, left ankle and foot: Secondary | ICD-10-CM | POA: Diagnosis not present

## 2017-08-03 ENCOUNTER — Ambulatory Visit: Payer: Medicare HMO | Admitting: Podiatry

## 2017-08-03 DIAGNOSIS — L97521 Non-pressure chronic ulcer of other part of left foot limited to breakdown of skin: Secondary | ICD-10-CM

## 2017-08-03 DIAGNOSIS — M109 Gout, unspecified: Secondary | ICD-10-CM

## 2017-08-03 NOTE — Progress Notes (Signed)
  Subjective:  Patient ID: Chad Burgess, male    DOB: 1941-06-07,  MRN: [id number]  Chief Complaint  Patient presents with  . Wound Check    fu right 1st toe had some pain that has woken him up in the middle of the night but it is looking much better    77 y.o. male returns for wound care. Believes the wound to be improved. States that he has had some pain in his toe that wakes him up at night. Denies N/V/F/Ch.  Objective:  There were no vitals filed for this visit. General AA&O x3. Normal mood and affect.  Vascular Foot warm to touch.  Neurologic Sensation grossly diminished.  Dermatologic (Wound) Wound Location: L hallux IPJ laterally Wound Measurement: 0.3 x 0.3 without PTB Wound Base: Granular/Healthy Peri-wound: Reddened Exudate: None: wound tissue dry  Wound progress: Improved since last check.  Orthopedic: No pain to palpation either foot.   Assessment & Plan:  Patient was evaluated and treated and all questions answered.  S/p L Great Toe D&I, Arthrotomy -No debridement today -Dressed with Iodosorb, DSD. -We will send orders to home health care to cease packing but continue to dress the wounds Continue antibiotics through PICC to completion  Gout -Likely the cause of residual erythema and edema -Advised to take the colchicine that was rxed.  Return in about 1 week (around 08/10/2017) for Wound Care.

## 2017-08-05 ENCOUNTER — Other Ambulatory Visit: Payer: Self-pay | Admitting: *Deleted

## 2017-08-05 MED ORDER — COLCHICINE 0.6 MG PO TABS: 0.6000 mg | ORAL_TABLET | Freq: Every day | ORAL | 0 refills | Status: AC

## 2017-08-05 NOTE — Progress Notes (Signed)
Dr. Randie Heinz a 90 day supply of Colchicine with no extra refills.

## 2017-08-06 DIAGNOSIS — M86172 Other acute osteomyelitis, left ankle and foot: Secondary | ICD-10-CM | POA: Diagnosis not present

## 2017-08-06 DIAGNOSIS — S91122A Laceration with foreign body of left great toe without damage to nail, initial encounter: Secondary | ICD-10-CM | POA: Diagnosis not present

## 2017-08-06 DIAGNOSIS — R69 Illness, unspecified: Secondary | ICD-10-CM | POA: Diagnosis not present

## 2017-08-07 ENCOUNTER — Telehealth: Payer: Self-pay | Admitting: *Deleted

## 2017-08-07 NOTE — Telephone Encounter (Signed)
Refill request Colchicine. I reviewed orders, this rx had been filled on 08/05/2017, return fax informing [id number].

## 2017-08-10 DIAGNOSIS — Z452 Encounter for adjustment and management of vascular access device: Secondary | ICD-10-CM | POA: Diagnosis not present

## 2017-08-10 DIAGNOSIS — E119 Type 2 diabetes mellitus without complications: Secondary | ICD-10-CM | POA: Diagnosis not present

## 2017-08-10 DIAGNOSIS — L03032 Cellulitis of left toe: Secondary | ICD-10-CM | POA: Diagnosis not present

## 2017-08-10 DIAGNOSIS — S91122A Laceration with foreign body of left great toe without damage to nail, initial encounter: Secondary | ICD-10-CM | POA: Diagnosis not present

## 2017-08-10 DIAGNOSIS — M86172 Other acute osteomyelitis, left ankle and foot: Secondary | ICD-10-CM | POA: Diagnosis not present

## 2017-08-10 DIAGNOSIS — I10 Essential (primary) hypertension: Secondary | ICD-10-CM | POA: Diagnosis not present

## 2017-08-11 ENCOUNTER — Ambulatory Visit: Payer: Medicare HMO | Admitting: Podiatry

## 2017-08-11 DIAGNOSIS — M109 Gout, unspecified: Secondary | ICD-10-CM

## 2017-08-11 NOTE — Progress Notes (Signed)
  Subjective:  Patient ID: Chad Burgess, male    DOB: Aug 31, 1940,  MRN: [id number]  Chief Complaint  Patient presents with  . Wound Check    fu I&D left 1st toe still some drainage but seems to slowly be getting better    77 y.o. male returns for wound care. Believes the wound to be improving. Still draining a bit from the outside (lateral) side of the toe. Has been taking colchicine. Denies N/V/F/Ch.  Objective:  There were no vitals filed for this visit. General AA&O x3. Normal mood and affect.  Vascular Foot warm to touch.  Neurologic Sensation grossly diminished.  Dermatologic (Wound) Wound Location: L hallux IPJ laterally Wound Measurement: 0.3x0.3 without PTB Wound Base: Granular/Healthy Peri-wound: Normal Exudate: None: wound tissue dry  Wound progress: Improved since last check.  Orthopedic: No pain to palpation either foot.   Assessment & Plan:  Patient was evaluated and treated and all questions answered.  S/p L Great Toe D&I, Arthrotomy -Patient is doing well.  Wounds dressed today with Iodosorb and DSD.  No debridement needed today. Toe overall less edematous/erythematous. -Continue antibiotics to completion  Gout -Continue colchicine PRN.  Return in about 1 week (around 08/18/2017) for Wound care.

## 2017-08-12 ENCOUNTER — Telehealth: Payer: Self-pay | Admitting: Podiatry

## 2017-08-12 NOTE — Telephone Encounter (Signed)
[place] states she was unable to draw blood from PICC line, and was unable to draw blood from peripheral vein, it appeared pt was dehydrated and she encouraged him to increase fluids for blood draw Friday.

## 2017-08-12 NOTE — Telephone Encounter (Signed)
Hi, my name is Cristino Martes and I'm a nurse with [facility]. I'm calling to speak to someone about Mr. Linford blood that we draw weekly. So if you could call me back at [phone]. Thank you.

## 2017-08-13 LAB — FUNGUS CULTURE WITH STAIN

## 2017-08-13 LAB — FUNGUS CULTURE RESULT

## 2017-08-13 LAB — FUNGAL ORGANISM REFLEX

## 2017-08-14 DIAGNOSIS — I10 Essential (primary) hypertension: Secondary | ICD-10-CM | POA: Diagnosis not present

## 2017-08-14 DIAGNOSIS — L03032 Cellulitis of left toe: Secondary | ICD-10-CM | POA: Diagnosis not present

## 2017-08-14 DIAGNOSIS — Z452 Encounter for adjustment and management of vascular access device: Secondary | ICD-10-CM | POA: Diagnosis not present

## 2017-08-14 DIAGNOSIS — S91122A Laceration with foreign body of left great toe without damage to nail, initial encounter: Secondary | ICD-10-CM | POA: Diagnosis not present

## 2017-08-14 DIAGNOSIS — E119 Type 2 diabetes mellitus without complications: Secondary | ICD-10-CM | POA: Diagnosis not present

## 2017-08-14 DIAGNOSIS — M86172 Other acute osteomyelitis, left ankle and foot: Secondary | ICD-10-CM | POA: Diagnosis not present

## 2017-08-17 DIAGNOSIS — E119 Type 2 diabetes mellitus without complications: Secondary | ICD-10-CM | POA: Diagnosis not present

## 2017-08-17 DIAGNOSIS — Z452 Encounter for adjustment and management of vascular access device: Secondary | ICD-10-CM | POA: Diagnosis not present

## 2017-08-17 DIAGNOSIS — I10 Essential (primary) hypertension: Secondary | ICD-10-CM | POA: Diagnosis not present

## 2017-08-17 DIAGNOSIS — L03032 Cellulitis of left toe: Secondary | ICD-10-CM | POA: Diagnosis not present

## 2017-08-17 DIAGNOSIS — S91122A Laceration with foreign body of left great toe without damage to nail, initial encounter: Secondary | ICD-10-CM | POA: Diagnosis not present

## 2017-08-17 DIAGNOSIS — M86172 Other acute osteomyelitis, left ankle and foot: Secondary | ICD-10-CM | POA: Diagnosis not present

## 2017-08-18 ENCOUNTER — Ambulatory Visit: Payer: Medicare HMO | Admitting: Podiatry

## 2017-08-18 ENCOUNTER — Encounter: Payer: Self-pay | Admitting: Podiatry

## 2017-08-18 DIAGNOSIS — M109 Gout, unspecified: Secondary | ICD-10-CM

## 2017-08-18 NOTE — Progress Notes (Signed)
  Subjective:  Patient ID: Chad Burgess, male    DOB: 04/21/1941,  MRN: [id number]  Chief Complaint  Patient presents with  . Wound Check    F/U L great toe wound check Pt. stated," it's improving, no pain at all today."    77 y.o. male returns for wound care. Believes the wound to be improving.  States that he is almost finished with the antibiotics that he was prescribed.  Denies pain.Denies N/V/F/Ch.  Objective:  There were no vitals filed for this visit. General AA&O x3. Normal mood and affect.  Vascular Foot warm to touch.  Neurologic Sensation grossly diminished.  Dermatologic (Wound)  1.5 linear wound medial first MPJ with fibro-granular wound base, overlying hyperkeratosis.  No purulence.  Slight serosanguineous drainage.  No ascending cellulitis.  No probe to bone   0.6 x 0.4 ulceration lateral aspect of the hallux foot with granular wound base.  No probe to bone.  No surrounding erythema.  No ascending cellulitis.  No purulence.  No drainage.  Wound progress: Improved since last check.  Orthopedic: No pain to palpation either foot.   Assessment & Plan:  Patient was evaluated and treated and all questions answered.  Status post left great toe DNI, arthrotomy -Wound healing well as above.  Antibiotic almost completed.  Continue daily dressing changes with assistance of home health.  Antibiotic cream or medihoney.  Gout -Discussed possible treatments for chronic gout therapy.  Would avoid allopurinol due to interaction with Coumadin.  Advised patient should discuss with PCP on whether he should start [place] or whether he should switch from Coumadin as has been previously discussed the patient PCP.  15 minutes of face to face time were spent with the patient. >50% of this was spent on counseling and coordination of care. Specifically discussed with patient the diagnosis of gout and possible treatment options.  This discussion was separate from postoperative  discussion.  Return in about 1 week (around 08/25/2017) for Wound Care.

## 2017-08-18 NOTE — Progress Notes (Signed)
al

## 2017-08-18 NOTE — Patient Instructions (Signed)
Recommended Cream: AmLactin

## 2017-08-19 DIAGNOSIS — M86172 Other acute osteomyelitis, left ankle and foot: Secondary | ICD-10-CM | POA: Diagnosis not present

## 2017-08-19 DIAGNOSIS — S91122A Laceration with foreign body of left great toe without damage to nail, initial encounter: Secondary | ICD-10-CM | POA: Diagnosis not present

## 2017-08-21 DIAGNOSIS — L03032 Cellulitis of left toe: Secondary | ICD-10-CM | POA: Diagnosis not present

## 2017-08-21 DIAGNOSIS — E119 Type 2 diabetes mellitus without complications: Secondary | ICD-10-CM | POA: Diagnosis not present

## 2017-08-21 DIAGNOSIS — M86172 Other acute osteomyelitis, left ankle and foot: Secondary | ICD-10-CM | POA: Diagnosis not present

## 2017-08-21 DIAGNOSIS — Z452 Encounter for adjustment and management of vascular access device: Secondary | ICD-10-CM | POA: Diagnosis not present

## 2017-08-21 DIAGNOSIS — S91122A Laceration with foreign body of left great toe without damage to nail, initial encounter: Secondary | ICD-10-CM | POA: Diagnosis not present

## 2017-08-21 DIAGNOSIS — I10 Essential (primary) hypertension: Secondary | ICD-10-CM | POA: Diagnosis not present

## 2017-08-24 ENCOUNTER — Ambulatory Visit (INDEPENDENT_AMBULATORY_CARE_PROVIDER_SITE_OTHER): Payer: Medicare HMO | Admitting: Podiatry

## 2017-08-24 DIAGNOSIS — Z5181 Encounter for therapeutic drug level monitoring: Secondary | ICD-10-CM | POA: Diagnosis not present

## 2017-08-24 DIAGNOSIS — Z7901 Long term (current) use of anticoagulants: Secondary | ICD-10-CM | POA: Diagnosis not present

## 2017-08-24 DIAGNOSIS — L97521 Non-pressure chronic ulcer of other part of left foot limited to breakdown of skin: Secondary | ICD-10-CM | POA: Diagnosis not present

## 2017-08-24 DIAGNOSIS — M109 Gout, unspecified: Secondary | ICD-10-CM | POA: Diagnosis not present

## 2017-08-24 DIAGNOSIS — I482 Chronic atrial fibrillation: Secondary | ICD-10-CM | POA: Diagnosis not present

## 2017-08-25 ENCOUNTER — Telehealth: Payer: Self-pay | Admitting: Podiatry

## 2017-08-25 NOTE — Telephone Encounter (Signed)
Faxed orders to remove PICC line 08/31/2017 after the last dose of the antibiotic.

## 2017-08-25 NOTE — Progress Notes (Signed)
  Subjective:  Patient ID: Chad Burgess, male    DOB: 1940-06-27,  MRN: [id number]  Chief Complaint  Patient presents with  . Wound Check    F/U Lt great toe Pt. stated," it's improving, no pain at all today." Tx: iodine   77 y.o. male returns for wound care. Believes the wound to be improving.  Has only a couple days left on the antibiotics. Denies N/V/F/Ch.  Objective:  There were no vitals filed for this visit. General AA&O x3. Normal mood and affect.  Vascular Foot warm to touch.  Neurologic Sensation grossly diminished.  Dermatologic (Wound) Wound Location: Left medial first MPJ Wound Measurement: 1.0 linear wound Wound Base: Granular/Healthy Peri-wound: Calloused Exudate: None: wound tissue dry  Wound progress: Improved since last check.  Lateral left hallux 0.3 x 0.3 wound with granular base improving.  No erythema.  No drainage.  No ascending cellulitis of either wound.  Purulence.  No signs of acute infection.  Orthopedic: No pain to palpation either foot.   Assessment & Plan:  Patient was evaluated and treated and all questions answered.  Status post left great toe arthrotomy, debridement irrigation -Arthrotomy site gently debrided covered under global  Left great toe ulcer, osteomyelitis -Wound improving.  Continue home health care. - Aquacel to all open areas  -Continue antibiotics to completion.  No additional antibiotics after that  Return in about 1 week (around 08/31/2017) for Wound care.

## 2017-08-25 NOTE — Telephone Encounter (Signed)
Good morning, this is Coretta calling from [facility]. I'm calling on Mr. Delmundo who saw Dr. March Rummage yesterday. The pt is telling us that Dr. March Rummage said it was okay to stop the antibiotics on Wednesday and have the PICC line pulled. If someone could please give [place] those orders so I can make sure I do not send out anything else and I can have our nurse pull his PICC line. My call back number is [phone] and my fax number is [phone]. Again I do need orders to stop the antibiotics and to pull the PICC line. Thank you.

## 2017-08-25 NOTE — Telephone Encounter (Signed)
After his last dose of abx, the PICC line can be pulled on Wednesday. Can we send the appropriate orders?

## 2017-08-25 NOTE — Telephone Encounter (Signed)
I asked [place] if pt's last antibiotic dose was to be on 08/30/2017 or 08/31/2017. Debbie states pt's last antibiotic would be 08/31/2017. I told Jackelyn Poling I would send orders to remove the PICC line after the last dose of the antibiotic on 08/31/2017.

## 2017-08-26 DIAGNOSIS — M86172 Other acute osteomyelitis, left ankle and foot: Secondary | ICD-10-CM | POA: Diagnosis not present

## 2017-08-27 DIAGNOSIS — E11621 Type 2 diabetes mellitus with foot ulcer: Secondary | ICD-10-CM | POA: Diagnosis not present

## 2017-08-27 DIAGNOSIS — M869 Osteomyelitis, unspecified: Secondary | ICD-10-CM | POA: Diagnosis not present

## 2017-08-28 DIAGNOSIS — E119 Type 2 diabetes mellitus without complications: Secondary | ICD-10-CM | POA: Diagnosis not present

## 2017-08-28 DIAGNOSIS — S91122A Laceration with foreign body of left great toe without damage to nail, initial encounter: Secondary | ICD-10-CM | POA: Diagnosis not present

## 2017-08-28 DIAGNOSIS — L03032 Cellulitis of left toe: Secondary | ICD-10-CM | POA: Diagnosis not present

## 2017-08-28 DIAGNOSIS — M86172 Other acute osteomyelitis, left ankle and foot: Secondary | ICD-10-CM | POA: Diagnosis not present

## 2017-08-28 DIAGNOSIS — I10 Essential (primary) hypertension: Secondary | ICD-10-CM | POA: Diagnosis not present

## 2017-08-28 DIAGNOSIS — Z452 Encounter for adjustment and management of vascular access device: Secondary | ICD-10-CM | POA: Diagnosis not present

## 2017-08-28 DIAGNOSIS — E785 Hyperlipidemia, unspecified: Secondary | ICD-10-CM | POA: Diagnosis not present

## 2017-08-28 DIAGNOSIS — E1169 Type 2 diabetes mellitus with other specified complication: Secondary | ICD-10-CM | POA: Diagnosis not present

## 2017-08-28 DIAGNOSIS — E1140 Type 2 diabetes mellitus with diabetic neuropathy, unspecified: Secondary | ICD-10-CM | POA: Diagnosis not present

## 2017-08-31 ENCOUNTER — Ambulatory Visit: Payer: Medicare HMO | Admitting: Podiatry

## 2017-08-31 ENCOUNTER — Other Ambulatory Visit: Payer: Self-pay | Admitting: Podiatry

## 2017-08-31 ENCOUNTER — Ambulatory Visit (INDEPENDENT_AMBULATORY_CARE_PROVIDER_SITE_OTHER): Payer: Medicare HMO

## 2017-08-31 ENCOUNTER — Telehealth: Payer: Self-pay | Admitting: Podiatry

## 2017-08-31 DIAGNOSIS — M1990 Unspecified osteoarthritis, unspecified site: Secondary | ICD-10-CM

## 2017-08-31 DIAGNOSIS — M79672 Pain in left foot: Secondary | ICD-10-CM

## 2017-08-31 DIAGNOSIS — M109 Gout, unspecified: Secondary | ICD-10-CM

## 2017-08-31 DIAGNOSIS — L97521 Non-pressure chronic ulcer of other part of left foot limited to breakdown of skin: Secondary | ICD-10-CM | POA: Diagnosis not present

## 2017-08-31 MED ORDER — METHYLPREDNISOLONE 4 MG PO TBPK: ORAL_TABLET | ORAL | 0 refills | Status: DC

## 2017-08-31 NOTE — Patient Instructions (Addendum)
No further antibiotic therapy after last dose of ceftriaxone today. Continued concern for gout. Rx for Medrol pak. F/u with Dr. Nyra Capes for anti-gout medication.

## 2017-08-31 NOTE — Telephone Encounter (Signed)
Dr. March Rummage states have [place] continue the PICC line until next appt 09/07/2017.

## 2017-08-31 NOTE — Telephone Encounter (Signed)
This is Chad Burgess, Therapist, sports in the pharmacy at [facility] in [facility]. We have orders to pull his PICC after his last dose today. However, his white blood count has gone back up from labs last week on the 20 th. The white count is 11.8. We are just checking if the MD for any reason wanted him to continue any further or is it okay to pull the PICC? You can call us back [phone]. I'm here today until 6 pm and I'm off tomorrow. If you call tomorrow, please ask for Coretta the pharmacy tech or Jeani Hawking the pharmacist for the order and we will notify home health agency. Thank you so much.

## 2017-08-31 NOTE — Telephone Encounter (Signed)
I informed Stanton Kidney, [place] and she states she will inform the [facility] agency supervising his care.

## 2017-08-31 NOTE — Progress Notes (Signed)
  Subjective:  Patient ID: Chad Burgess, male    DOB: 1940/11/01,  MRN: [id number]  Chief Complaint  Patient presents with  . Wound Check    F/U L great toe ulcer. PT. stated," it looks worse, I think it's infected. ALso, Friday I had white/yellow drainage and it got swollen and red." Tx: iodine   77 y.o. male returns for wound care. States that the L great toe looks worse. Started hurting a lot on Friday and became red and swollen. Reports white drainage from the wound. Due for last abx dose today through PICC line.  Objective:  There were no vitals filed for this visit. General AA&O x3. Normal mood and affect.  Vascular Foot warm to touch.  Neurologic Sensation grossly diminished.  Dermatologic (Wound) Wound Location: Left medial first MPJ Wound Measurement: 1.0 linear wound Wound Base: Granular/Healthy Peri-wound: Calloused Exudate: None: chalky exudate Local erythema, warmth. No ascending cellulitis.  Wound progress: worsened since last check.  Lateral left hallux 0.2 x 0.2 wound with granular base improving.  No erythema.  No drainage.  Orthopedic: Pain to palpation L 1st MPJ.   Assessment & Plan:  Patient was evaluated and treated and all questions answered.  Gout L Great Toe, s/p Arthrotomy -Worsening 1st MPJ redness concerning for gout. -Rx Medrol pak for pain and inflammation. -F/u with Dr. Nyra Capes for anti-gout medication. Options limited 2/2 coumadin use. -XR taken, no medial 1st metatarsal osseous erosions.  Left great toe ulcer, osteomyelitis -Wounds improving. Dressed with medihoney and DSD. -Last dose of ceftriaxone today. Ok to remove PICC after last dose. -XR show worsened changes to the proximal phalanx. Clinically the IPJ has been without signs of infection, erythema. Patient has been on IV abx. Possible changes due to gout rather than infection.  Return in about 1 week (around 09/07/2017) for Wound Care, Gout F/u.

## 2017-09-02 DIAGNOSIS — S91122A Laceration with foreign body of left great toe without damage to nail, initial encounter: Secondary | ICD-10-CM | POA: Diagnosis not present

## 2017-09-02 DIAGNOSIS — M86172 Other acute osteomyelitis, left ankle and foot: Secondary | ICD-10-CM | POA: Diagnosis not present

## 2017-09-02 DIAGNOSIS — Z452 Encounter for adjustment and management of vascular access device: Secondary | ICD-10-CM | POA: Diagnosis not present

## 2017-09-02 DIAGNOSIS — L03032 Cellulitis of left toe: Secondary | ICD-10-CM | POA: Diagnosis not present

## 2017-09-02 DIAGNOSIS — I10 Essential (primary) hypertension: Secondary | ICD-10-CM | POA: Diagnosis not present

## 2017-09-02 DIAGNOSIS — E119 Type 2 diabetes mellitus without complications: Secondary | ICD-10-CM | POA: Diagnosis not present

## 2017-09-07 ENCOUNTER — Ambulatory Visit: Payer: Medicare HMO | Admitting: Podiatry

## 2017-09-07 DIAGNOSIS — M109 Gout, unspecified: Secondary | ICD-10-CM

## 2017-09-07 DIAGNOSIS — M79673 Pain in unspecified foot: Secondary | ICD-10-CM | POA: Diagnosis not present

## 2017-09-07 DIAGNOSIS — Z6831 Body mass index (BMI) 31.0-31.9, adult: Secondary | ICD-10-CM | POA: Diagnosis not present

## 2017-09-07 NOTE — Progress Notes (Signed)
  Subjective:  Patient ID: Chad Burgess, male    DOB: 04-19-1941,  MRN: [id number]  Chief Complaint  Patient presents with  . Wound Check    F/U Lt great toe ulcer Pt. stated," no pain, I think it's doing okay." Tx: iodine -yellow/orange drainage noticed   77 y.o. male returns for wound care. States the foot is doing ok. HHC has been coming MWF for dressing changes, did not come this Friday.   Objective:  There were no vitals filed for this visit. General AA&O x3. Normal mood and affect.  Vascular Foot warm to touch.  Neurologic Sensation grossly diminished.  Dermatologic (Wound) Wound Location: Left medial first MPJ Wound Measurement: 0.3x0.3  Wound Base: Granular/Healthy Peri-wound: Calloused Exudate: None: chalky white exudate Local erythema, warmth. No ascending cellulitis.  Wound progress: improved since last check.  Pinpoint L hallux IPJ open ulceration with ss discharge.  Lateral left hallux wound epithelialized.  Orthopedic: Pain to palpation L 1st MPJ.   Assessment & Plan:  Patient was evaluated and treated and all questions answered.  Gout L Great Toe, s/p Arthrotomy -Redness decreased s/p use of steroid pack, confirming redness likely from inflammation rather than infection. -F/u with Dr. Nyra Capes for anti-gout medication. Options limited 2/2 coumadin use. Letter sent to Dr. Nyra Capes regarding gout medication. -Wound dressed with medihoney and DSD.  Left great toe ulcer, osteomyelitis -Wound healed. -Completed ceftriaxone. Ok to remove PICC.  Ok to slowly return to activity.  Return in about 2 weeks (around 09/21/2017) for Wound Care.

## 2017-09-09 DIAGNOSIS — I10 Essential (primary) hypertension: Secondary | ICD-10-CM | POA: Diagnosis not present

## 2017-09-09 DIAGNOSIS — Z452 Encounter for adjustment and management of vascular access device: Secondary | ICD-10-CM | POA: Diagnosis not present

## 2017-09-09 DIAGNOSIS — L03032 Cellulitis of left toe: Secondary | ICD-10-CM | POA: Diagnosis not present

## 2017-09-09 DIAGNOSIS — M86172 Other acute osteomyelitis, left ankle and foot: Secondary | ICD-10-CM | POA: Diagnosis not present

## 2017-09-09 DIAGNOSIS — S91122A Laceration with foreign body of left great toe without damage to nail, initial encounter: Secondary | ICD-10-CM | POA: Diagnosis not present

## 2017-09-09 DIAGNOSIS — E119 Type 2 diabetes mellitus without complications: Secondary | ICD-10-CM | POA: Diagnosis not present

## 2017-09-14 DIAGNOSIS — I482 Chronic atrial fibrillation: Secondary | ICD-10-CM | POA: Diagnosis not present

## 2017-09-14 DIAGNOSIS — Z7901 Long term (current) use of anticoagulants: Secondary | ICD-10-CM | POA: Diagnosis not present

## 2017-09-14 DIAGNOSIS — Z5181 Encounter for therapeutic drug level monitoring: Secondary | ICD-10-CM | POA: Diagnosis not present

## 2017-09-14 DIAGNOSIS — M1A9XX1 Chronic gout, unspecified, with tophus (tophi): Secondary | ICD-10-CM | POA: Diagnosis not present

## 2017-09-21 ENCOUNTER — Ambulatory Visit: Payer: Medicare HMO | Admitting: Podiatry

## 2017-09-21 DIAGNOSIS — M109 Gout, unspecified: Secondary | ICD-10-CM

## 2017-09-21 DIAGNOSIS — L97521 Non-pressure chronic ulcer of other part of left foot limited to breakdown of skin: Secondary | ICD-10-CM

## 2017-10-05 NOTE — Progress Notes (Signed)
  Subjective:  Patient ID: Chad Burgess, male    DOB: May 20, 1941,  MRN: [id number]  Chief Complaint  Patient presents with  . Foot Ulcer    F/U Lt great toe ulcer Pt. stated," Ulcer looks and feels good.' Tx: medihoney and iodine  . Gout    F/U gout, arthritis Pt. stated," doing alright w/ the gout. I saw Dr. Nyra Capes Monday of last week."    77 y.o. male returns for wound care.  States that the left great toe ulcers look good and feels good.  Has been using meta honey and iodine.  Denies drainage redness and swelling.  States the gout is doing pretty well saw Dr. Nyra Capes last week  Objective:  There were no vitals filed for this visit. General AA&O x3. Normal mood and affect.  Vascular Foot warm to touch.  Neurologic Sensation grossly diminished.  Dermatologic (Wound) Wounds with overlying epithelization no  warmth erythema no drainage.  No signs of acute infection  Orthopedic: No pain to palpation L 1st MPJ.   Assessment & Plan:  Patient was evaluated and treated and all questions answered.  Gout L Great Toe, s/p Arthrotomy -Wound healed. -F/u with Dr. Nyra Capes for gout regimen. -Trial normal shoegear. Advised to watch for  worsening of the wound and to put the surgical shoe back on to experience this.  Left great toe ulcer -Wound healed.  Ok to slowly return to activity.  No follow-ups on file.

## 2017-10-14 DIAGNOSIS — I10 Essential (primary) hypertension: Secondary | ICD-10-CM | POA: Diagnosis not present

## 2017-10-14 DIAGNOSIS — Z5181 Encounter for therapeutic drug level monitoring: Secondary | ICD-10-CM | POA: Diagnosis not present

## 2017-10-14 DIAGNOSIS — I482 Chronic atrial fibrillation: Secondary | ICD-10-CM | POA: Diagnosis not present

## 2017-10-14 DIAGNOSIS — Z7901 Long term (current) use of anticoagulants: Secondary | ICD-10-CM | POA: Diagnosis not present

## 2017-10-19 ENCOUNTER — Ambulatory Visit: Payer: Medicare HMO | Admitting: Podiatry

## 2017-10-19 DIAGNOSIS — M109 Gout, unspecified: Secondary | ICD-10-CM

## 2017-10-19 DIAGNOSIS — L97521 Non-pressure chronic ulcer of other part of left foot limited to breakdown of skin: Secondary | ICD-10-CM

## 2017-10-19 NOTE — Progress Notes (Signed)
  Subjective:  Patient ID: Chad Burgess, male    DOB: [birth year],  MRN: [id number]  Chief Complaint  Patient presents with  . Foot Ulcer    F/U L great toe ulcer Pt. stated," it's healling up, it looks good." Tx: none   . Gout    F/U gout Pt. stated," I F/U w/ Dr. Nyra Capes as I was told and he Rx Uloric."   77 y.o. male returns for wound care. All wounds look healed. Taking Uloric rxed by Dr. Nyra Capes. Reports occasional soreness in the L great toe joint. No other issues.  Objective:  There were no vitals filed for this visit. General AA&O x3. Normal mood and affect.  Vascular Foot warm to touch.  Neurologic Sensation grossly diminished.  Dermatologic (Wound) Wounds healed. No open ulceration.  Orthopedic: No pain to palpation L 1st MPJ. Slight edema L 1st MPJ   Assessment & Plan:  Patient was evaluated and treated and all questions answered.  Gout L Great Toe, s/p Arthrotomy -Still some edema and soreness. -Continue management with Dr. Nyra Capes for Gout.  Left great toe ulcer -Remains healed.   No follow-ups on file.

## 2017-10-20 DIAGNOSIS — Z961 Presence of intraocular lens: Secondary | ICD-10-CM | POA: Diagnosis not present

## 2017-10-20 DIAGNOSIS — E119 Type 2 diabetes mellitus without complications: Secondary | ICD-10-CM | POA: Diagnosis not present

## 2017-11-06 DIAGNOSIS — E1169 Type 2 diabetes mellitus with other specified complication: Secondary | ICD-10-CM | POA: Diagnosis not present

## 2017-11-06 DIAGNOSIS — Z7901 Long term (current) use of anticoagulants: Secondary | ICD-10-CM | POA: Diagnosis not present

## 2017-11-06 DIAGNOSIS — I482 Chronic atrial fibrillation: Secondary | ICD-10-CM | POA: Diagnosis not present

## 2017-11-11 DIAGNOSIS — Z5181 Encounter for therapeutic drug level monitoring: Secondary | ICD-10-CM | POA: Diagnosis not present

## 2017-11-11 DIAGNOSIS — Z7901 Long term (current) use of anticoagulants: Secondary | ICD-10-CM | POA: Diagnosis not present

## 2017-11-11 DIAGNOSIS — I482 Chronic atrial fibrillation: Secondary | ICD-10-CM | POA: Diagnosis not present

## 2017-11-11 DIAGNOSIS — M109 Gout, unspecified: Secondary | ICD-10-CM | POA: Diagnosis not present

## 2017-11-23 DIAGNOSIS — E1169 Type 2 diabetes mellitus with other specified complication: Secondary | ICD-10-CM | POA: Diagnosis not present

## 2017-11-23 DIAGNOSIS — E1140 Type 2 diabetes mellitus with diabetic neuropathy, unspecified: Secondary | ICD-10-CM | POA: Diagnosis not present

## 2017-11-23 DIAGNOSIS — I10 Essential (primary) hypertension: Secondary | ICD-10-CM | POA: Diagnosis not present

## 2017-11-30 DIAGNOSIS — E1169 Type 2 diabetes mellitus with other specified complication: Secondary | ICD-10-CM | POA: Diagnosis not present

## 2017-11-30 DIAGNOSIS — E1140 Type 2 diabetes mellitus with diabetic neuropathy, unspecified: Secondary | ICD-10-CM | POA: Diagnosis not present

## 2017-11-30 DIAGNOSIS — I10 Essential (primary) hypertension: Secondary | ICD-10-CM | POA: Diagnosis not present

## 2017-11-30 DIAGNOSIS — E785 Hyperlipidemia, unspecified: Secondary | ICD-10-CM | POA: Diagnosis not present

## 2017-12-06 DIAGNOSIS — E785 Hyperlipidemia, unspecified: Secondary | ICD-10-CM | POA: Diagnosis not present

## 2017-12-06 DIAGNOSIS — E1169 Type 2 diabetes mellitus with other specified complication: Secondary | ICD-10-CM | POA: Diagnosis not present

## 2017-12-06 DIAGNOSIS — I10 Essential (primary) hypertension: Secondary | ICD-10-CM | POA: Diagnosis not present

## 2017-12-06 DIAGNOSIS — E1140 Type 2 diabetes mellitus with diabetic neuropathy, unspecified: Secondary | ICD-10-CM | POA: Diagnosis not present

## 2018-01-06 DIAGNOSIS — I10 Essential (primary) hypertension: Secondary | ICD-10-CM | POA: Diagnosis not present

## 2018-01-06 DIAGNOSIS — E1140 Type 2 diabetes mellitus with diabetic neuropathy, unspecified: Secondary | ICD-10-CM | POA: Diagnosis not present

## 2018-01-06 DIAGNOSIS — I482 Chronic atrial fibrillation: Secondary | ICD-10-CM | POA: Diagnosis not present

## 2018-01-06 DIAGNOSIS — E785 Hyperlipidemia, unspecified: Secondary | ICD-10-CM | POA: Diagnosis not present

## 2018-01-18 DIAGNOSIS — I482 Chronic atrial fibrillation: Secondary | ICD-10-CM | POA: Diagnosis not present

## 2018-01-18 DIAGNOSIS — Z0000 Encounter for general adult medical examination without abnormal findings: Secondary | ICD-10-CM | POA: Diagnosis not present

## 2018-01-18 DIAGNOSIS — Z7901 Long term (current) use of anticoagulants: Secondary | ICD-10-CM | POA: Diagnosis not present

## 2018-01-18 DIAGNOSIS — Z5181 Encounter for therapeutic drug level monitoring: Secondary | ICD-10-CM | POA: Diagnosis not present

## 2018-02-05 DIAGNOSIS — I482 Chronic atrial fibrillation: Secondary | ICD-10-CM | POA: Diagnosis not present

## 2018-02-05 DIAGNOSIS — Z7901 Long term (current) use of anticoagulants: Secondary | ICD-10-CM | POA: Diagnosis not present

## 2018-02-05 DIAGNOSIS — E1140 Type 2 diabetes mellitus with diabetic neuropathy, unspecified: Secondary | ICD-10-CM | POA: Diagnosis not present

## 2018-02-05 DIAGNOSIS — E785 Hyperlipidemia, unspecified: Secondary | ICD-10-CM | POA: Diagnosis not present

## 2018-02-15 DIAGNOSIS — I482 Chronic atrial fibrillation: Secondary | ICD-10-CM | POA: Diagnosis not present

## 2018-02-15 DIAGNOSIS — Z5181 Encounter for therapeutic drug level monitoring: Secondary | ICD-10-CM | POA: Diagnosis not present

## 2018-02-15 DIAGNOSIS — Z6831 Body mass index (BMI) 31.0-31.9, adult: Secondary | ICD-10-CM | POA: Diagnosis not present

## 2018-02-15 DIAGNOSIS — Z7901 Long term (current) use of anticoagulants: Secondary | ICD-10-CM | POA: Diagnosis not present

## 2018-03-01 DIAGNOSIS — L578 Other skin changes due to chronic exposure to nonionizing radiation: Secondary | ICD-10-CM | POA: Diagnosis not present

## 2018-03-01 DIAGNOSIS — C44229 Squamous cell carcinoma of skin of left ear and external auricular canal: Secondary | ICD-10-CM | POA: Diagnosis not present

## 2018-03-08 DIAGNOSIS — Z6831 Body mass index (BMI) 31.0-31.9, adult: Secondary | ICD-10-CM | POA: Diagnosis not present

## 2018-03-08 DIAGNOSIS — Z7901 Long term (current) use of anticoagulants: Secondary | ICD-10-CM | POA: Diagnosis not present

## 2018-03-08 DIAGNOSIS — E669 Obesity, unspecified: Secondary | ICD-10-CM | POA: Diagnosis not present

## 2018-03-08 DIAGNOSIS — I482 Chronic atrial fibrillation: Secondary | ICD-10-CM | POA: Diagnosis not present

## 2018-03-15 DIAGNOSIS — Z7901 Long term (current) use of anticoagulants: Secondary | ICD-10-CM | POA: Diagnosis not present

## 2018-03-15 DIAGNOSIS — E1169 Type 2 diabetes mellitus with other specified complication: Secondary | ICD-10-CM | POA: Diagnosis not present

## 2018-03-15 DIAGNOSIS — E1140 Type 2 diabetes mellitus with diabetic neuropathy, unspecified: Secondary | ICD-10-CM | POA: Diagnosis not present

## 2018-03-15 DIAGNOSIS — I10 Essential (primary) hypertension: Secondary | ICD-10-CM | POA: Diagnosis not present

## 2018-03-15 DIAGNOSIS — Z5181 Encounter for therapeutic drug level monitoring: Secondary | ICD-10-CM | POA: Diagnosis not present

## 2018-03-15 DIAGNOSIS — Z6830 Body mass index (BMI) 30.0-30.9, adult: Secondary | ICD-10-CM | POA: Diagnosis not present

## 2018-03-15 DIAGNOSIS — I482 Chronic atrial fibrillation, unspecified: Secondary | ICD-10-CM | POA: Diagnosis not present

## 2018-03-16 DIAGNOSIS — R69 Illness, unspecified: Secondary | ICD-10-CM | POA: Diagnosis not present

## 2018-03-22 DIAGNOSIS — R0602 Shortness of breath: Secondary | ICD-10-CM | POA: Diagnosis not present

## 2018-03-22 DIAGNOSIS — E1129 Type 2 diabetes mellitus with other diabetic kidney complication: Secondary | ICD-10-CM | POA: Diagnosis not present

## 2018-03-22 DIAGNOSIS — E1151 Type 2 diabetes mellitus with diabetic peripheral angiopathy without gangrene: Secondary | ICD-10-CM | POA: Diagnosis not present

## 2018-03-22 DIAGNOSIS — E1140 Type 2 diabetes mellitus with diabetic neuropathy, unspecified: Secondary | ICD-10-CM | POA: Diagnosis not present

## 2018-03-22 DIAGNOSIS — R0902 Hypoxemia: Secondary | ICD-10-CM | POA: Diagnosis not present

## 2018-03-22 DIAGNOSIS — I700 Atherosclerosis of aorta: Secondary | ICD-10-CM | POA: Diagnosis not present

## 2018-03-24 DIAGNOSIS — C44222 Squamous cell carcinoma of skin of right ear and external auricular canal: Secondary | ICD-10-CM | POA: Diagnosis not present

## 2018-04-05 DIAGNOSIS — R69 Illness, unspecified: Secondary | ICD-10-CM | POA: Diagnosis not present

## 2018-04-08 DIAGNOSIS — E1140 Type 2 diabetes mellitus with diabetic neuropathy, unspecified: Secondary | ICD-10-CM | POA: Diagnosis not present

## 2018-04-08 DIAGNOSIS — I10 Essential (primary) hypertension: Secondary | ICD-10-CM | POA: Diagnosis not present

## 2018-04-08 DIAGNOSIS — E1129 Type 2 diabetes mellitus with other diabetic kidney complication: Secondary | ICD-10-CM | POA: Diagnosis not present

## 2018-04-08 DIAGNOSIS — E1151 Type 2 diabetes mellitus with diabetic peripheral angiopathy without gangrene: Secondary | ICD-10-CM | POA: Diagnosis not present

## 2018-04-21 DIAGNOSIS — I4891 Unspecified atrial fibrillation: Secondary | ICD-10-CM | POA: Diagnosis not present

## 2018-04-21 DIAGNOSIS — Z5181 Encounter for therapeutic drug level monitoring: Secondary | ICD-10-CM | POA: Diagnosis not present

## 2018-04-21 DIAGNOSIS — R0609 Other forms of dyspnea: Secondary | ICD-10-CM | POA: Diagnosis not present

## 2018-04-21 DIAGNOSIS — I700 Atherosclerosis of aorta: Secondary | ICD-10-CM | POA: Diagnosis not present

## 2018-04-21 DIAGNOSIS — Z7901 Long term (current) use of anticoagulants: Secondary | ICD-10-CM | POA: Diagnosis not present

## 2018-04-21 DIAGNOSIS — L218 Other seborrheic dermatitis: Secondary | ICD-10-CM | POA: Diagnosis not present

## 2018-04-26 DIAGNOSIS — Z7901 Long term (current) use of anticoagulants: Secondary | ICD-10-CM | POA: Diagnosis not present

## 2018-04-26 DIAGNOSIS — Z6830 Body mass index (BMI) 30.0-30.9, adult: Secondary | ICD-10-CM | POA: Diagnosis not present

## 2018-04-26 DIAGNOSIS — Z5181 Encounter for therapeutic drug level monitoring: Secondary | ICD-10-CM | POA: Diagnosis not present

## 2018-04-26 DIAGNOSIS — I482 Chronic atrial fibrillation, unspecified: Secondary | ICD-10-CM | POA: Diagnosis not present

## 2018-05-12 DIAGNOSIS — Z7901 Long term (current) use of anticoagulants: Secondary | ICD-10-CM | POA: Diagnosis not present

## 2018-05-12 DIAGNOSIS — I482 Chronic atrial fibrillation, unspecified: Secondary | ICD-10-CM | POA: Diagnosis not present

## 2018-05-12 DIAGNOSIS — Z5181 Encounter for therapeutic drug level monitoring: Secondary | ICD-10-CM | POA: Diagnosis not present

## 2018-05-12 DIAGNOSIS — Z139 Encounter for screening, unspecified: Secondary | ICD-10-CM | POA: Diagnosis not present

## 2018-06-02 DIAGNOSIS — I4891 Unspecified atrial fibrillation: Secondary | ICD-10-CM | POA: Diagnosis not present

## 2018-06-02 DIAGNOSIS — T68XXXA Hypothermia, initial encounter: Secondary | ICD-10-CM | POA: Diagnosis not present

## 2018-06-02 DIAGNOSIS — R404 Transient alteration of awareness: Secondary | ICD-10-CM | POA: Diagnosis not present

## 2018-06-02 DIAGNOSIS — R069 Unspecified abnormalities of breathing: Secondary | ICD-10-CM | POA: Diagnosis not present

## 2018-06-02 DIAGNOSIS — R410 Disorientation, unspecified: Secondary | ICD-10-CM | POA: Diagnosis not present

## 2018-06-03 DIAGNOSIS — Z6830 Body mass index (BMI) 30.0-30.9, adult: Secondary | ICD-10-CM | POA: Diagnosis not present

## 2018-06-03 DIAGNOSIS — E871 Hypo-osmolality and hyponatremia: Secondary | ICD-10-CM | POA: Diagnosis not present

## 2018-06-03 DIAGNOSIS — J189 Pneumonia, unspecified organism: Secondary | ICD-10-CM | POA: Diagnosis not present

## 2018-06-03 DIAGNOSIS — E669 Obesity, unspecified: Secondary | ICD-10-CM | POA: Diagnosis not present

## 2018-06-03 DIAGNOSIS — J984 Other disorders of lung: Secondary | ICD-10-CM | POA: Diagnosis not present

## 2018-06-03 DIAGNOSIS — D72819 Decreased white blood cell count, unspecified: Secondary | ICD-10-CM | POA: Diagnosis not present

## 2018-06-03 DIAGNOSIS — J181 Lobar pneumonia, unspecified organism: Secondary | ICD-10-CM | POA: Diagnosis not present

## 2018-06-03 DIAGNOSIS — R6520 Severe sepsis without septic shock: Secondary | ICD-10-CM | POA: Diagnosis not present

## 2018-06-03 DIAGNOSIS — R0602 Shortness of breath: Secondary | ICD-10-CM | POA: Diagnosis not present

## 2018-06-03 DIAGNOSIS — A419 Sepsis, unspecified organism: Secondary | ICD-10-CM | POA: Diagnosis not present

## 2018-06-03 DIAGNOSIS — I361 Nonrheumatic tricuspid (valve) insufficiency: Secondary | ICD-10-CM | POA: Diagnosis not present

## 2018-06-03 DIAGNOSIS — D689 Coagulation defect, unspecified: Secondary | ICD-10-CM | POA: Diagnosis not present

## 2018-06-03 DIAGNOSIS — I350 Nonrheumatic aortic (valve) stenosis: Secondary | ICD-10-CM | POA: Diagnosis not present

## 2018-06-03 DIAGNOSIS — D696 Thrombocytopenia, unspecified: Secondary | ICD-10-CM | POA: Diagnosis not present

## 2018-06-03 DIAGNOSIS — A409 Streptococcal sepsis, unspecified: Secondary | ICD-10-CM | POA: Diagnosis not present

## 2018-06-03 DIAGNOSIS — I340 Nonrheumatic mitral (valve) insufficiency: Secondary | ICD-10-CM | POA: Diagnosis not present

## 2018-06-03 DIAGNOSIS — Z7901 Long term (current) use of anticoagulants: Secondary | ICD-10-CM | POA: Diagnosis not present

## 2018-06-03 DIAGNOSIS — E872 Acidosis: Secondary | ICD-10-CM | POA: Diagnosis not present

## 2018-06-03 DIAGNOSIS — E785 Hyperlipidemia, unspecified: Secondary | ICD-10-CM | POA: Diagnosis not present

## 2018-06-03 DIAGNOSIS — J9601 Acute respiratory failure with hypoxia: Secondary | ICD-10-CM | POA: Diagnosis not present

## 2018-06-03 DIAGNOSIS — I5031 Acute diastolic (congestive) heart failure: Secondary | ICD-10-CM | POA: Diagnosis not present

## 2018-06-03 DIAGNOSIS — I482 Chronic atrial fibrillation, unspecified: Secondary | ICD-10-CM | POA: Diagnosis not present

## 2018-06-06 DIAGNOSIS — I350 Nonrheumatic aortic (valve) stenosis: Secondary | ICD-10-CM

## 2018-06-06 DIAGNOSIS — I340 Nonrheumatic mitral (valve) insufficiency: Secondary | ICD-10-CM

## 2018-06-06 DIAGNOSIS — I361 Nonrheumatic tricuspid (valve) insufficiency: Secondary | ICD-10-CM

## 2018-06-08 DIAGNOSIS — Z7901 Long term (current) use of anticoagulants: Secondary | ICD-10-CM | POA: Diagnosis not present

## 2018-06-08 DIAGNOSIS — I482 Chronic atrial fibrillation, unspecified: Secondary | ICD-10-CM | POA: Diagnosis not present

## 2018-06-08 DIAGNOSIS — Z6830 Body mass index (BMI) 30.0-30.9, adult: Secondary | ICD-10-CM | POA: Diagnosis not present

## 2018-06-08 DIAGNOSIS — E669 Obesity, unspecified: Secondary | ICD-10-CM | POA: Diagnosis not present

## 2018-06-10 DIAGNOSIS — D72819 Decreased white blood cell count, unspecified: Secondary | ICD-10-CM | POA: Diagnosis not present

## 2018-06-10 DIAGNOSIS — E119 Type 2 diabetes mellitus without complications: Secondary | ICD-10-CM | POA: Diagnosis not present

## 2018-06-10 DIAGNOSIS — I5031 Acute diastolic (congestive) heart failure: Secondary | ICD-10-CM | POA: Diagnosis not present

## 2018-06-10 DIAGNOSIS — I482 Chronic atrial fibrillation, unspecified: Secondary | ICD-10-CM | POA: Diagnosis not present

## 2018-06-10 DIAGNOSIS — Z9181 History of falling: Secondary | ICD-10-CM | POA: Diagnosis not present

## 2018-06-10 DIAGNOSIS — D696 Thrombocytopenia, unspecified: Secondary | ICD-10-CM | POA: Diagnosis not present

## 2018-06-10 DIAGNOSIS — J189 Pneumonia, unspecified organism: Secondary | ICD-10-CM | POA: Diagnosis not present

## 2018-06-10 DIAGNOSIS — I110 Hypertensive heart disease with heart failure: Secondary | ICD-10-CM | POA: Diagnosis not present

## 2018-06-10 DIAGNOSIS — D689 Coagulation defect, unspecified: Secondary | ICD-10-CM | POA: Diagnosis not present

## 2018-06-10 DIAGNOSIS — Z9049 Acquired absence of other specified parts of digestive tract: Secondary | ICD-10-CM | POA: Diagnosis not present

## 2018-06-10 DIAGNOSIS — M109 Gout, unspecified: Secondary | ICD-10-CM | POA: Diagnosis not present

## 2018-06-10 DIAGNOSIS — E871 Hypo-osmolality and hyponatremia: Secondary | ICD-10-CM | POA: Diagnosis not present

## 2018-06-10 DIAGNOSIS — J9601 Acute respiratory failure with hypoxia: Secondary | ICD-10-CM | POA: Diagnosis not present

## 2018-06-11 DIAGNOSIS — Z9049 Acquired absence of other specified parts of digestive tract: Secondary | ICD-10-CM | POA: Diagnosis not present

## 2018-06-11 DIAGNOSIS — I5031 Acute diastolic (congestive) heart failure: Secondary | ICD-10-CM | POA: Diagnosis not present

## 2018-06-11 DIAGNOSIS — E119 Type 2 diabetes mellitus without complications: Secondary | ICD-10-CM | POA: Diagnosis not present

## 2018-06-11 DIAGNOSIS — I110 Hypertensive heart disease with heart failure: Secondary | ICD-10-CM | POA: Diagnosis not present

## 2018-06-11 DIAGNOSIS — I482 Chronic atrial fibrillation, unspecified: Secondary | ICD-10-CM | POA: Diagnosis not present

## 2018-06-11 DIAGNOSIS — D696 Thrombocytopenia, unspecified: Secondary | ICD-10-CM | POA: Diagnosis not present

## 2018-06-11 DIAGNOSIS — D689 Coagulation defect, unspecified: Secondary | ICD-10-CM | POA: Diagnosis not present

## 2018-06-11 DIAGNOSIS — E871 Hypo-osmolality and hyponatremia: Secondary | ICD-10-CM | POA: Diagnosis not present

## 2018-06-11 DIAGNOSIS — Z9181 History of falling: Secondary | ICD-10-CM | POA: Diagnosis not present

## 2018-06-11 DIAGNOSIS — J189 Pneumonia, unspecified organism: Secondary | ICD-10-CM | POA: Diagnosis not present

## 2018-06-11 DIAGNOSIS — J9601 Acute respiratory failure with hypoxia: Secondary | ICD-10-CM | POA: Diagnosis not present

## 2018-06-11 DIAGNOSIS — M109 Gout, unspecified: Secondary | ICD-10-CM | POA: Diagnosis not present

## 2018-06-11 DIAGNOSIS — D72819 Decreased white blood cell count, unspecified: Secondary | ICD-10-CM | POA: Diagnosis not present

## 2018-06-14 DIAGNOSIS — J189 Pneumonia, unspecified organism: Secondary | ICD-10-CM | POA: Diagnosis not present

## 2018-06-14 DIAGNOSIS — I482 Chronic atrial fibrillation, unspecified: Secondary | ICD-10-CM | POA: Diagnosis not present

## 2018-06-14 DIAGNOSIS — Z5181 Encounter for therapeutic drug level monitoring: Secondary | ICD-10-CM | POA: Diagnosis not present

## 2018-06-14 DIAGNOSIS — A419 Sepsis, unspecified organism: Secondary | ICD-10-CM | POA: Diagnosis not present

## 2018-06-14 DIAGNOSIS — R5381 Other malaise: Secondary | ICD-10-CM | POA: Diagnosis not present

## 2018-06-15 DIAGNOSIS — I482 Chronic atrial fibrillation, unspecified: Secondary | ICD-10-CM | POA: Diagnosis not present

## 2018-06-15 DIAGNOSIS — J189 Pneumonia, unspecified organism: Secondary | ICD-10-CM | POA: Diagnosis not present

## 2018-06-15 DIAGNOSIS — I110 Hypertensive heart disease with heart failure: Secondary | ICD-10-CM | POA: Diagnosis not present

## 2018-06-15 DIAGNOSIS — D696 Thrombocytopenia, unspecified: Secondary | ICD-10-CM | POA: Diagnosis not present

## 2018-06-15 DIAGNOSIS — D72819 Decreased white blood cell count, unspecified: Secondary | ICD-10-CM | POA: Diagnosis not present

## 2018-06-15 DIAGNOSIS — E871 Hypo-osmolality and hyponatremia: Secondary | ICD-10-CM | POA: Diagnosis not present

## 2018-06-15 DIAGNOSIS — Z9049 Acquired absence of other specified parts of digestive tract: Secondary | ICD-10-CM | POA: Diagnosis not present

## 2018-06-15 DIAGNOSIS — J9601 Acute respiratory failure with hypoxia: Secondary | ICD-10-CM | POA: Diagnosis not present

## 2018-06-15 DIAGNOSIS — I5031 Acute diastolic (congestive) heart failure: Secondary | ICD-10-CM | POA: Diagnosis not present

## 2018-06-15 DIAGNOSIS — M109 Gout, unspecified: Secondary | ICD-10-CM | POA: Diagnosis not present

## 2018-06-15 DIAGNOSIS — D689 Coagulation defect, unspecified: Secondary | ICD-10-CM | POA: Diagnosis not present

## 2018-06-15 DIAGNOSIS — E119 Type 2 diabetes mellitus without complications: Secondary | ICD-10-CM | POA: Diagnosis not present

## 2018-06-15 DIAGNOSIS — Z9181 History of falling: Secondary | ICD-10-CM | POA: Diagnosis not present

## 2018-06-29 DIAGNOSIS — R918 Other nonspecific abnormal finding of lung field: Secondary | ICD-10-CM | POA: Diagnosis not present

## 2018-06-29 DIAGNOSIS — J984 Other disorders of lung: Secondary | ICD-10-CM | POA: Diagnosis not present

## 2018-06-29 DIAGNOSIS — J189 Pneumonia, unspecified organism: Secondary | ICD-10-CM | POA: Diagnosis not present

## 2018-07-09 DIAGNOSIS — I482 Chronic atrial fibrillation, unspecified: Secondary | ICD-10-CM | POA: Diagnosis not present

## 2018-07-09 DIAGNOSIS — Z7901 Long term (current) use of anticoagulants: Secondary | ICD-10-CM | POA: Diagnosis not present

## 2018-07-09 DIAGNOSIS — I4891 Unspecified atrial fibrillation: Secondary | ICD-10-CM | POA: Diagnosis not present

## 2018-07-14 DIAGNOSIS — Z9181 History of falling: Secondary | ICD-10-CM | POA: Diagnosis not present

## 2018-07-14 DIAGNOSIS — I4891 Unspecified atrial fibrillation: Secondary | ICD-10-CM | POA: Diagnosis not present

## 2018-07-14 DIAGNOSIS — Z5181 Encounter for therapeutic drug level monitoring: Secondary | ICD-10-CM | POA: Diagnosis not present

## 2018-07-14 DIAGNOSIS — I482 Chronic atrial fibrillation, unspecified: Secondary | ICD-10-CM | POA: Diagnosis not present

## 2018-07-14 DIAGNOSIS — Z7901 Long term (current) use of anticoagulants: Secondary | ICD-10-CM | POA: Diagnosis not present

## 2018-07-19 DIAGNOSIS — I10 Essential (primary) hypertension: Secondary | ICD-10-CM | POA: Diagnosis not present

## 2018-07-19 DIAGNOSIS — E1169 Type 2 diabetes mellitus with other specified complication: Secondary | ICD-10-CM | POA: Diagnosis not present

## 2018-07-19 DIAGNOSIS — E1140 Type 2 diabetes mellitus with diabetic neuropathy, unspecified: Secondary | ICD-10-CM | POA: Diagnosis not present

## 2018-07-26 DIAGNOSIS — E1169 Type 2 diabetes mellitus with other specified complication: Secondary | ICD-10-CM | POA: Diagnosis not present

## 2018-07-26 DIAGNOSIS — E1140 Type 2 diabetes mellitus with diabetic neuropathy, unspecified: Secondary | ICD-10-CM | POA: Diagnosis not present

## 2018-07-26 DIAGNOSIS — I10 Essential (primary) hypertension: Secondary | ICD-10-CM | POA: Diagnosis not present

## 2018-07-26 DIAGNOSIS — E785 Hyperlipidemia, unspecified: Secondary | ICD-10-CM | POA: Diagnosis not present

## 2018-08-11 DIAGNOSIS — Z5181 Encounter for therapeutic drug level monitoring: Secondary | ICD-10-CM | POA: Diagnosis not present

## 2018-08-11 DIAGNOSIS — Z7901 Long term (current) use of anticoagulants: Secondary | ICD-10-CM | POA: Diagnosis not present

## 2018-08-11 DIAGNOSIS — Z0000 Encounter for general adult medical examination without abnormal findings: Secondary | ICD-10-CM | POA: Diagnosis not present

## 2018-08-11 DIAGNOSIS — I482 Chronic atrial fibrillation, unspecified: Secondary | ICD-10-CM | POA: Diagnosis not present

## 2018-09-07 DIAGNOSIS — E1140 Type 2 diabetes mellitus with diabetic neuropathy, unspecified: Secondary | ICD-10-CM | POA: Diagnosis not present

## 2018-09-07 DIAGNOSIS — I482 Chronic atrial fibrillation, unspecified: Secondary | ICD-10-CM | POA: Diagnosis not present

## 2018-09-07 DIAGNOSIS — I10 Essential (primary) hypertension: Secondary | ICD-10-CM | POA: Diagnosis not present

## 2018-09-07 DIAGNOSIS — E785 Hyperlipidemia, unspecified: Secondary | ICD-10-CM | POA: Diagnosis not present

## 2018-09-13 DIAGNOSIS — Z5181 Encounter for therapeutic drug level monitoring: Secondary | ICD-10-CM | POA: Diagnosis not present

## 2018-09-13 DIAGNOSIS — Z7901 Long term (current) use of anticoagulants: Secondary | ICD-10-CM | POA: Diagnosis not present

## 2018-09-13 DIAGNOSIS — I482 Chronic atrial fibrillation, unspecified: Secondary | ICD-10-CM | POA: Diagnosis not present

## 2018-10-07 DIAGNOSIS — E1140 Type 2 diabetes mellitus with diabetic neuropathy, unspecified: Secondary | ICD-10-CM | POA: Diagnosis not present

## 2018-10-07 DIAGNOSIS — I10 Essential (primary) hypertension: Secondary | ICD-10-CM | POA: Diagnosis not present

## 2018-10-07 DIAGNOSIS — E1169 Type 2 diabetes mellitus with other specified complication: Secondary | ICD-10-CM | POA: Diagnosis not present

## 2018-10-07 DIAGNOSIS — I482 Chronic atrial fibrillation, unspecified: Secondary | ICD-10-CM | POA: Diagnosis not present

## 2018-11-05 DIAGNOSIS — I482 Chronic atrial fibrillation, unspecified: Secondary | ICD-10-CM | POA: Diagnosis not present

## 2018-11-05 DIAGNOSIS — E1169 Type 2 diabetes mellitus with other specified complication: Secondary | ICD-10-CM | POA: Diagnosis not present

## 2018-11-05 DIAGNOSIS — I10 Essential (primary) hypertension: Secondary | ICD-10-CM | POA: Diagnosis not present

## 2018-11-05 DIAGNOSIS — E1140 Type 2 diabetes mellitus with diabetic neuropathy, unspecified: Secondary | ICD-10-CM | POA: Diagnosis not present

## 2018-11-08 DIAGNOSIS — Z7901 Long term (current) use of anticoagulants: Secondary | ICD-10-CM | POA: Diagnosis not present

## 2018-11-08 DIAGNOSIS — E1140 Type 2 diabetes mellitus with diabetic neuropathy, unspecified: Secondary | ICD-10-CM | POA: Diagnosis not present

## 2018-11-08 DIAGNOSIS — Z5181 Encounter for therapeutic drug level monitoring: Secondary | ICD-10-CM | POA: Diagnosis not present

## 2018-11-08 DIAGNOSIS — I4891 Unspecified atrial fibrillation: Secondary | ICD-10-CM | POA: Diagnosis not present

## 2018-11-08 DIAGNOSIS — I10 Essential (primary) hypertension: Secondary | ICD-10-CM | POA: Diagnosis not present

## 2018-11-08 DIAGNOSIS — E1169 Type 2 diabetes mellitus with other specified complication: Secondary | ICD-10-CM | POA: Diagnosis not present

## 2018-11-15 DIAGNOSIS — E1140 Type 2 diabetes mellitus with diabetic neuropathy, unspecified: Secondary | ICD-10-CM | POA: Diagnosis not present

## 2018-11-15 DIAGNOSIS — I10 Essential (primary) hypertension: Secondary | ICD-10-CM | POA: Diagnosis not present

## 2018-11-15 DIAGNOSIS — E785 Hyperlipidemia, unspecified: Secondary | ICD-10-CM | POA: Diagnosis not present

## 2018-11-15 DIAGNOSIS — E1169 Type 2 diabetes mellitus with other specified complication: Secondary | ICD-10-CM | POA: Diagnosis not present

## 2018-12-07 DIAGNOSIS — E785 Hyperlipidemia, unspecified: Secondary | ICD-10-CM | POA: Diagnosis not present

## 2018-12-07 DIAGNOSIS — E1169 Type 2 diabetes mellitus with other specified complication: Secondary | ICD-10-CM | POA: Diagnosis not present

## 2018-12-07 DIAGNOSIS — I10 Essential (primary) hypertension: Secondary | ICD-10-CM | POA: Diagnosis not present

## 2018-12-07 DIAGNOSIS — E1140 Type 2 diabetes mellitus with diabetic neuropathy, unspecified: Secondary | ICD-10-CM | POA: Diagnosis not present

## 2018-12-22 DIAGNOSIS — Z7901 Long term (current) use of anticoagulants: Secondary | ICD-10-CM | POA: Diagnosis not present

## 2018-12-22 DIAGNOSIS — I4891 Unspecified atrial fibrillation: Secondary | ICD-10-CM | POA: Diagnosis not present

## 2018-12-22 DIAGNOSIS — Z5181 Encounter for therapeutic drug level monitoring: Secondary | ICD-10-CM | POA: Diagnosis not present

## 2019-01-07 DIAGNOSIS — E785 Hyperlipidemia, unspecified: Secondary | ICD-10-CM | POA: Diagnosis not present

## 2019-01-07 DIAGNOSIS — E1169 Type 2 diabetes mellitus with other specified complication: Secondary | ICD-10-CM | POA: Diagnosis not present

## 2019-01-07 DIAGNOSIS — E1140 Type 2 diabetes mellitus with diabetic neuropathy, unspecified: Secondary | ICD-10-CM | POA: Diagnosis not present

## 2019-01-07 DIAGNOSIS — I10 Essential (primary) hypertension: Secondary | ICD-10-CM | POA: Diagnosis not present

## 2019-01-19 DIAGNOSIS — I4891 Unspecified atrial fibrillation: Secondary | ICD-10-CM | POA: Diagnosis not present

## 2019-01-19 DIAGNOSIS — R0602 Shortness of breath: Secondary | ICD-10-CM | POA: Diagnosis not present

## 2019-01-19 DIAGNOSIS — Z7901 Long term (current) use of anticoagulants: Secondary | ICD-10-CM | POA: Diagnosis not present

## 2019-01-19 DIAGNOSIS — R0600 Dyspnea, unspecified: Secondary | ICD-10-CM | POA: Diagnosis not present

## 2019-01-19 DIAGNOSIS — Z5181 Encounter for therapeutic drug level monitoring: Secondary | ICD-10-CM | POA: Diagnosis not present

## 2019-01-19 DIAGNOSIS — R0609 Other forms of dyspnea: Secondary | ICD-10-CM | POA: Diagnosis not present

## 2019-01-20 DIAGNOSIS — R0609 Other forms of dyspnea: Secondary | ICD-10-CM | POA: Diagnosis not present

## 2019-01-20 DIAGNOSIS — R079 Chest pain, unspecified: Secondary | ICD-10-CM

## 2019-01-20 DIAGNOSIS — R0600 Dyspnea, unspecified: Secondary | ICD-10-CM | POA: Diagnosis not present

## 2019-01-26 DIAGNOSIS — R0600 Dyspnea, unspecified: Secondary | ICD-10-CM | POA: Diagnosis not present

## 2019-02-07 DIAGNOSIS — I10 Essential (primary) hypertension: Secondary | ICD-10-CM | POA: Diagnosis not present

## 2019-02-07 DIAGNOSIS — E785 Hyperlipidemia, unspecified: Secondary | ICD-10-CM | POA: Diagnosis not present

## 2019-02-07 DIAGNOSIS — E1169 Type 2 diabetes mellitus with other specified complication: Secondary | ICD-10-CM | POA: Diagnosis not present

## 2019-02-07 DIAGNOSIS — E1140 Type 2 diabetes mellitus with diabetic neuropathy, unspecified: Secondary | ICD-10-CM | POA: Diagnosis not present

## 2019-02-21 DIAGNOSIS — E785 Hyperlipidemia, unspecified: Secondary | ICD-10-CM | POA: Diagnosis not present

## 2019-02-21 DIAGNOSIS — Z7901 Long term (current) use of anticoagulants: Secondary | ICD-10-CM | POA: Diagnosis not present

## 2019-02-21 DIAGNOSIS — E1140 Type 2 diabetes mellitus with diabetic neuropathy, unspecified: Secondary | ICD-10-CM | POA: Diagnosis not present

## 2019-02-21 DIAGNOSIS — I10 Essential (primary) hypertension: Secondary | ICD-10-CM | POA: Diagnosis not present

## 2019-02-21 DIAGNOSIS — E1169 Type 2 diabetes mellitus with other specified complication: Secondary | ICD-10-CM | POA: Diagnosis not present

## 2019-02-25 DIAGNOSIS — M170 Bilateral primary osteoarthritis of knee: Secondary | ICD-10-CM | POA: Diagnosis not present

## 2019-02-25 DIAGNOSIS — R936 Abnormal findings on diagnostic imaging of limbs: Secondary | ICD-10-CM | POA: Diagnosis not present

## 2019-02-25 DIAGNOSIS — M25561 Pain in right knee: Secondary | ICD-10-CM | POA: Diagnosis not present

## 2019-03-04 IMAGING — DX DG FOOT 2V*L*
2 series · 2 of 2 positions shown · non-contrast
Comparison: 07/13/2017

CLINICAL DATA: LEFT great toe surgery today, postop

EXAM:
LEFT FOOT - 2 VIEW

[foot ap]
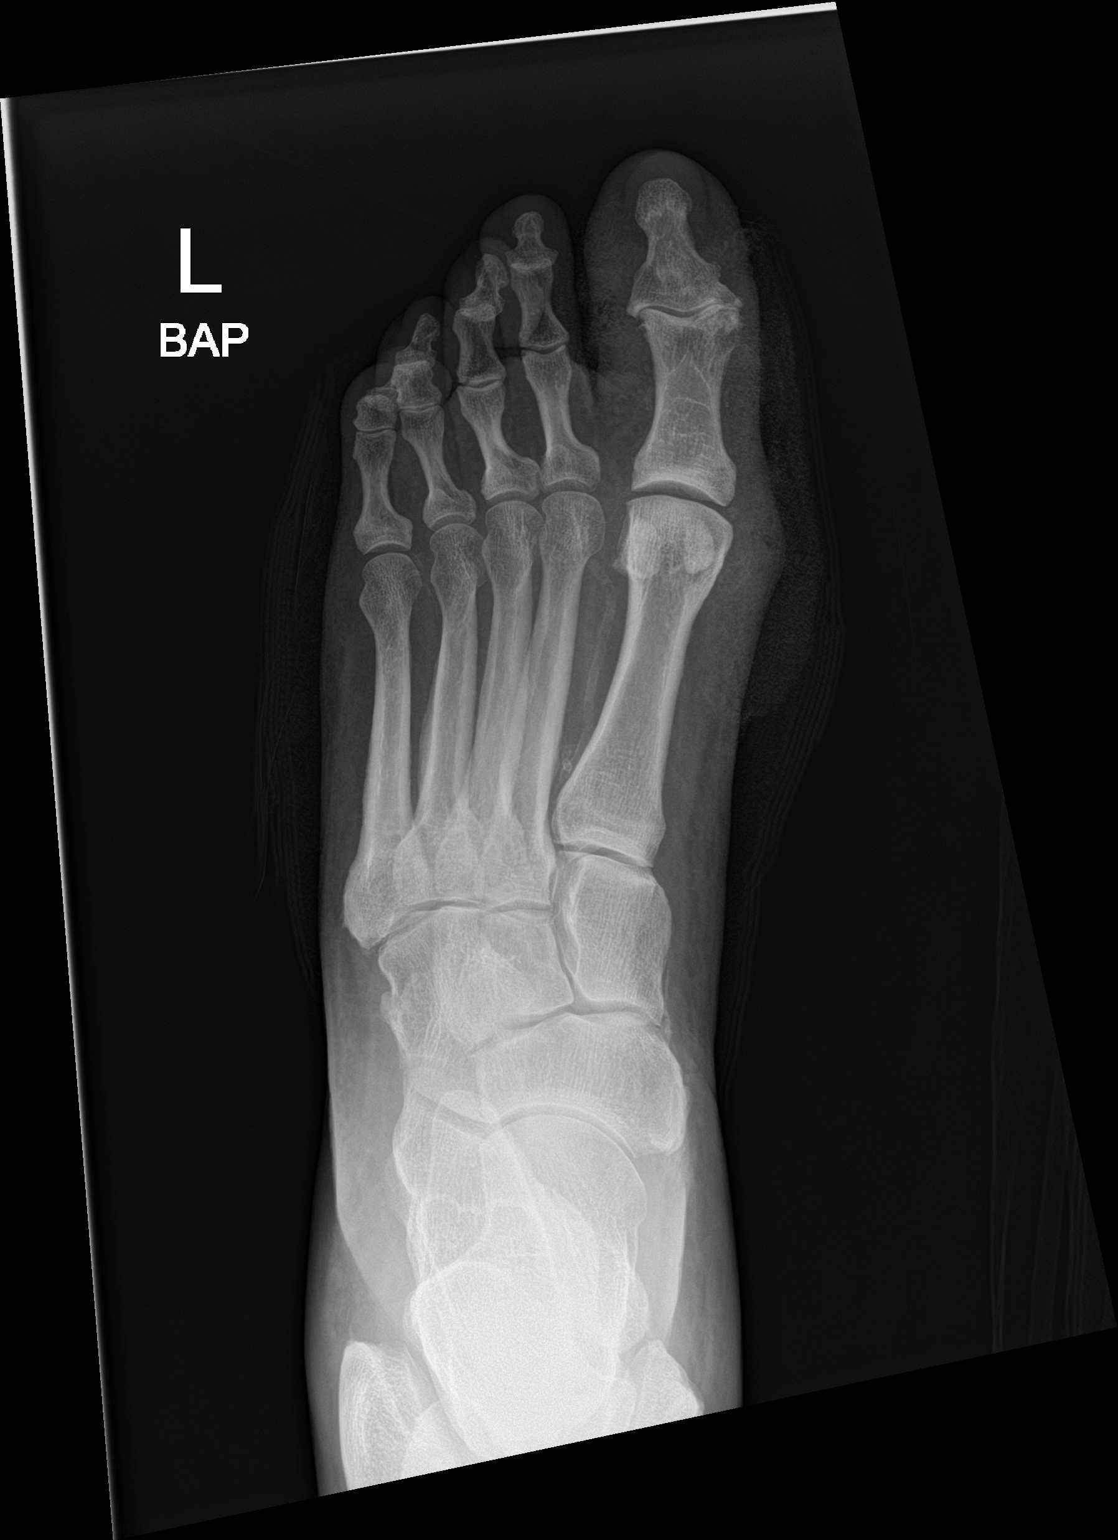

[foot lat]
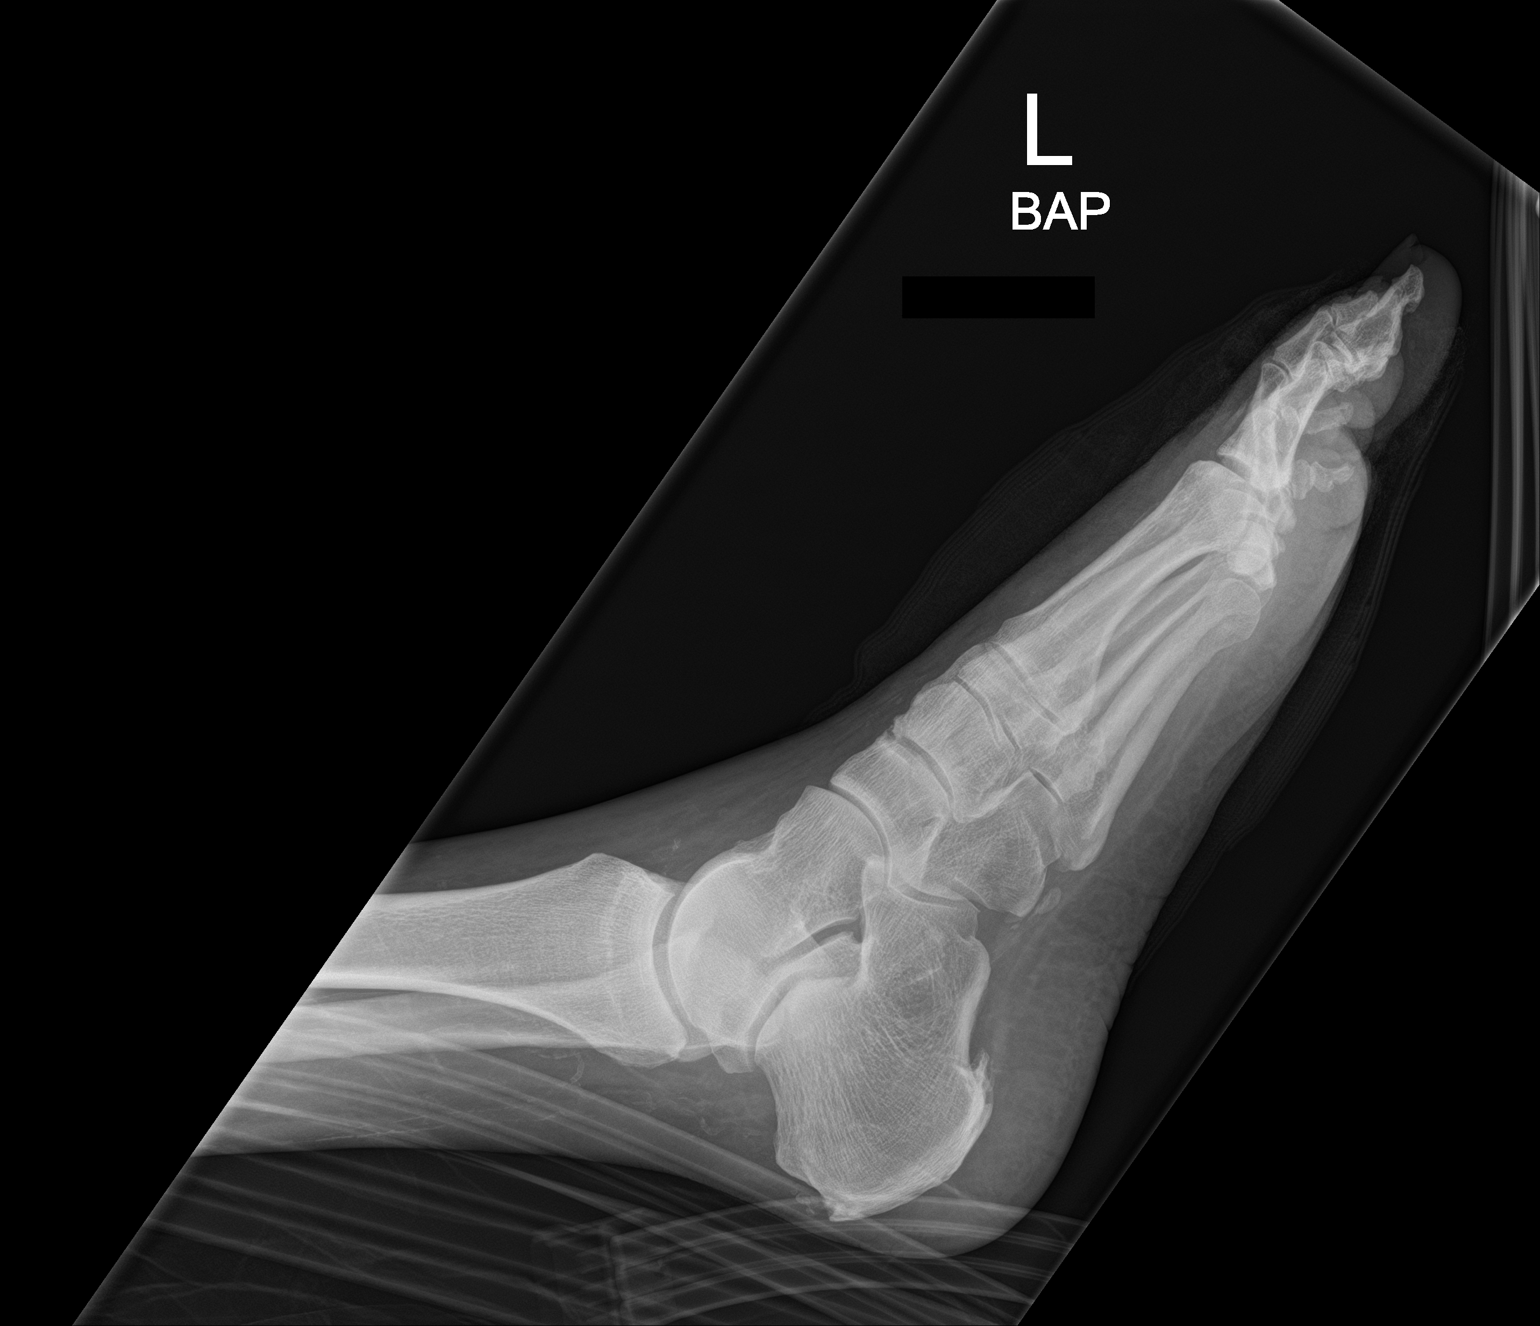

[2 of 2 positions shown; findings below may reference images not displayed]

FINDINGS: Dressing artifacts at great toe.

Degenerative changes at IP joint great toe.

Question bone destruction at the medial margin of the head of the
proximal phalanx.

No acute fracture, dislocation or additional bone destruction.

Scattered small vessel vascular calcifications.

Small plantar calcaneal spur.
IMPRESSION: Degenerative changes at IP joint great toe with bone destruction
questioned at the medial margin of the head of the proximal phalanx
of the great toe question osteomyelitis.

## 2019-03-09 DIAGNOSIS — E785 Hyperlipidemia, unspecified: Secondary | ICD-10-CM | POA: Diagnosis not present

## 2019-03-09 DIAGNOSIS — E1169 Type 2 diabetes mellitus with other specified complication: Secondary | ICD-10-CM | POA: Diagnosis not present

## 2019-03-09 DIAGNOSIS — E1140 Type 2 diabetes mellitus with diabetic neuropathy, unspecified: Secondary | ICD-10-CM | POA: Diagnosis not present

## 2019-03-09 DIAGNOSIS — I10 Essential (primary) hypertension: Secondary | ICD-10-CM | POA: Diagnosis not present

## 2019-03-23 DIAGNOSIS — Z7901 Long term (current) use of anticoagulants: Secondary | ICD-10-CM | POA: Diagnosis not present

## 2019-03-23 DIAGNOSIS — G4700 Insomnia, unspecified: Secondary | ICD-10-CM | POA: Diagnosis not present

## 2019-03-23 DIAGNOSIS — I482 Chronic atrial fibrillation, unspecified: Secondary | ICD-10-CM | POA: Diagnosis not present

## 2019-03-23 DIAGNOSIS — Z5181 Encounter for therapeutic drug level monitoring: Secondary | ICD-10-CM | POA: Diagnosis not present

## 2019-04-08 DIAGNOSIS — I482 Chronic atrial fibrillation, unspecified: Secondary | ICD-10-CM | POA: Diagnosis not present

## 2019-04-08 DIAGNOSIS — E1140 Type 2 diabetes mellitus with diabetic neuropathy, unspecified: Secondary | ICD-10-CM | POA: Diagnosis not present

## 2019-04-08 DIAGNOSIS — E1169 Type 2 diabetes mellitus with other specified complication: Secondary | ICD-10-CM | POA: Diagnosis not present

## 2019-04-08 DIAGNOSIS — E785 Hyperlipidemia, unspecified: Secondary | ICD-10-CM | POA: Diagnosis not present

## 2019-04-22 DIAGNOSIS — M170 Bilateral primary osteoarthritis of knee: Secondary | ICD-10-CM | POA: Diagnosis not present

## 2019-04-22 DIAGNOSIS — J310 Chronic rhinitis: Secondary | ICD-10-CM | POA: Diagnosis not present

## 2019-04-22 DIAGNOSIS — Z5181 Encounter for therapeutic drug level monitoring: Secondary | ICD-10-CM | POA: Diagnosis not present

## 2019-04-22 DIAGNOSIS — Z7901 Long term (current) use of anticoagulants: Secondary | ICD-10-CM | POA: Diagnosis not present

## 2019-04-22 DIAGNOSIS — I4891 Unspecified atrial fibrillation: Secondary | ICD-10-CM | POA: Diagnosis not present

## 2019-05-09 DIAGNOSIS — E1140 Type 2 diabetes mellitus with diabetic neuropathy, unspecified: Secondary | ICD-10-CM | POA: Diagnosis not present

## 2019-05-09 DIAGNOSIS — E1169 Type 2 diabetes mellitus with other specified complication: Secondary | ICD-10-CM | POA: Diagnosis not present

## 2019-05-09 DIAGNOSIS — I4891 Unspecified atrial fibrillation: Secondary | ICD-10-CM | POA: Diagnosis not present

## 2019-05-09 DIAGNOSIS — E785 Hyperlipidemia, unspecified: Secondary | ICD-10-CM | POA: Diagnosis not present

## 2019-05-11 DIAGNOSIS — Z5181 Encounter for therapeutic drug level monitoring: Secondary | ICD-10-CM | POA: Diagnosis not present

## 2019-05-11 DIAGNOSIS — Z7901 Long term (current) use of anticoagulants: Secondary | ICD-10-CM | POA: Diagnosis not present

## 2019-05-11 DIAGNOSIS — I4891 Unspecified atrial fibrillation: Secondary | ICD-10-CM | POA: Diagnosis not present

## 2019-05-12 DIAGNOSIS — C44229 Squamous cell carcinoma of skin of left ear and external auricular canal: Secondary | ICD-10-CM | POA: Diagnosis not present

## 2019-05-12 DIAGNOSIS — C44219 Basal cell carcinoma of skin of left ear and external auricular canal: Secondary | ICD-10-CM | POA: Diagnosis not present

## 2019-05-12 DIAGNOSIS — L570 Actinic keratosis: Secondary | ICD-10-CM | POA: Diagnosis not present

## 2019-05-30 DIAGNOSIS — E119 Type 2 diabetes mellitus without complications: Secondary | ICD-10-CM | POA: Diagnosis not present

## 2019-06-08 DIAGNOSIS — I4891 Unspecified atrial fibrillation: Secondary | ICD-10-CM | POA: Diagnosis not present

## 2019-06-08 DIAGNOSIS — E785 Hyperlipidemia, unspecified: Secondary | ICD-10-CM | POA: Diagnosis not present

## 2019-06-08 DIAGNOSIS — E1140 Type 2 diabetes mellitus with diabetic neuropathy, unspecified: Secondary | ICD-10-CM | POA: Diagnosis not present

## 2019-06-08 DIAGNOSIS — E1169 Type 2 diabetes mellitus with other specified complication: Secondary | ICD-10-CM | POA: Diagnosis not present

## 2019-06-13 DIAGNOSIS — Z7901 Long term (current) use of anticoagulants: Secondary | ICD-10-CM | POA: Diagnosis not present

## 2019-06-13 DIAGNOSIS — I4891 Unspecified atrial fibrillation: Secondary | ICD-10-CM | POA: Diagnosis not present

## 2019-06-13 DIAGNOSIS — Z5181 Encounter for therapeutic drug level monitoring: Secondary | ICD-10-CM | POA: Diagnosis not present

## 2019-06-15 DIAGNOSIS — Z5181 Encounter for therapeutic drug level monitoring: Secondary | ICD-10-CM | POA: Diagnosis not present

## 2019-06-15 DIAGNOSIS — Z7901 Long term (current) use of anticoagulants: Secondary | ICD-10-CM | POA: Diagnosis not present

## 2019-06-15 DIAGNOSIS — I4891 Unspecified atrial fibrillation: Secondary | ICD-10-CM | POA: Diagnosis not present

## 2019-06-29 DIAGNOSIS — I4891 Unspecified atrial fibrillation: Secondary | ICD-10-CM | POA: Diagnosis not present

## 2019-06-29 DIAGNOSIS — Z7901 Long term (current) use of anticoagulants: Secondary | ICD-10-CM | POA: Diagnosis not present

## 2019-06-29 DIAGNOSIS — Z5181 Encounter for therapeutic drug level monitoring: Secondary | ICD-10-CM | POA: Diagnosis not present

## 2019-07-08 DIAGNOSIS — E1169 Type 2 diabetes mellitus with other specified complication: Secondary | ICD-10-CM | POA: Diagnosis not present

## 2019-07-08 DIAGNOSIS — E785 Hyperlipidemia, unspecified: Secondary | ICD-10-CM | POA: Diagnosis not present

## 2019-07-08 DIAGNOSIS — I4891 Unspecified atrial fibrillation: Secondary | ICD-10-CM | POA: Diagnosis not present

## 2019-07-08 DIAGNOSIS — E1140 Type 2 diabetes mellitus with diabetic neuropathy, unspecified: Secondary | ICD-10-CM | POA: Diagnosis not present

## 2019-07-20 DIAGNOSIS — C44219 Basal cell carcinoma of skin of left ear and external auricular canal: Secondary | ICD-10-CM | POA: Diagnosis not present

## 2019-07-20 DIAGNOSIS — C44222 Squamous cell carcinoma of skin of right ear and external auricular canal: Secondary | ICD-10-CM | POA: Diagnosis not present

## 2019-07-20 DIAGNOSIS — C44229 Squamous cell carcinoma of skin of left ear and external auricular canal: Secondary | ICD-10-CM | POA: Diagnosis not present

## 2019-07-20 DIAGNOSIS — L570 Actinic keratosis: Secondary | ICD-10-CM | POA: Diagnosis not present

## 2019-07-27 DIAGNOSIS — Z5181 Encounter for therapeutic drug level monitoring: Secondary | ICD-10-CM | POA: Diagnosis not present

## 2019-07-27 DIAGNOSIS — R262 Difficulty in walking, not elsewhere classified: Secondary | ICD-10-CM | POA: Diagnosis not present

## 2019-07-27 DIAGNOSIS — J040 Acute laryngitis: Secondary | ICD-10-CM | POA: Diagnosis not present

## 2019-07-27 DIAGNOSIS — S161XXD Strain of muscle, fascia and tendon at neck level, subsequent encounter: Secondary | ICD-10-CM | POA: Diagnosis not present

## 2019-07-27 DIAGNOSIS — I4891 Unspecified atrial fibrillation: Secondary | ICD-10-CM | POA: Diagnosis not present

## 2019-08-07 DIAGNOSIS — E1169 Type 2 diabetes mellitus with other specified complication: Secondary | ICD-10-CM | POA: Diagnosis not present

## 2019-08-07 DIAGNOSIS — E785 Hyperlipidemia, unspecified: Secondary | ICD-10-CM | POA: Diagnosis not present

## 2019-08-07 DIAGNOSIS — I4891 Unspecified atrial fibrillation: Secondary | ICD-10-CM | POA: Diagnosis not present

## 2019-08-07 DIAGNOSIS — E1140 Type 2 diabetes mellitus with diabetic neuropathy, unspecified: Secondary | ICD-10-CM | POA: Diagnosis not present

## 2019-08-24 DIAGNOSIS — Z139 Encounter for screening, unspecified: Secondary | ICD-10-CM | POA: Diagnosis not present

## 2019-08-24 DIAGNOSIS — Z5181 Encounter for therapeutic drug level monitoring: Secondary | ICD-10-CM | POA: Diagnosis not present

## 2019-08-24 DIAGNOSIS — Z7189 Other specified counseling: Secondary | ICD-10-CM | POA: Diagnosis not present

## 2019-08-24 DIAGNOSIS — Z7901 Long term (current) use of anticoagulants: Secondary | ICD-10-CM | POA: Diagnosis not present

## 2019-08-24 DIAGNOSIS — I4891 Unspecified atrial fibrillation: Secondary | ICD-10-CM | POA: Diagnosis not present

## 2019-08-24 DIAGNOSIS — J449 Chronic obstructive pulmonary disease, unspecified: Secondary | ICD-10-CM | POA: Diagnosis not present

## 2019-08-24 DIAGNOSIS — Z136 Encounter for screening for cardiovascular disorders: Secondary | ICD-10-CM | POA: Diagnosis not present

## 2019-08-24 DIAGNOSIS — Z0000 Encounter for general adult medical examination without abnormal findings: Secondary | ICD-10-CM | POA: Diagnosis not present

## 2019-08-29 ENCOUNTER — Other Ambulatory Visit: Payer: Self-pay | Admitting: Podiatry

## 2019-08-29 DIAGNOSIS — M79671 Pain in right foot: Secondary | ICD-10-CM

## 2019-08-30 ENCOUNTER — Ambulatory Visit (INDEPENDENT_AMBULATORY_CARE_PROVIDER_SITE_OTHER): Payer: Medicare Other

## 2019-08-30 ENCOUNTER — Ambulatory Visit: Payer: Medicare Other | Admitting: Podiatry

## 2019-08-30 ENCOUNTER — Other Ambulatory Visit: Payer: Self-pay

## 2019-08-30 DIAGNOSIS — L97411 Non-pressure chronic ulcer of right heel and midfoot limited to breakdown of skin: Secondary | ICD-10-CM

## 2019-08-30 DIAGNOSIS — L97511 Non-pressure chronic ulcer of other part of right foot limited to breakdown of skin: Secondary | ICD-10-CM | POA: Diagnosis not present

## 2019-08-30 DIAGNOSIS — M79671 Pain in right foot: Secondary | ICD-10-CM

## 2019-08-30 NOTE — Progress Notes (Signed)
  Subjective:  Patient ID: Chad Burgess, male    DOB: 1940-12-15,  MRN: [id number]  CC: Right heel lesion for 4-5 weeks. Very sore. Denes nown injury. Looking like a little round place. Denies redness and swelling. Reports yellow drainage. Denies warmth and odor. Has been applying  Silvadene cream and band-aid and doing epsom salt soaks.  79 y.o. male presents for wound care. Hx confirmed with patient.  Objective:  Physical Exam: Wound Location: right heel Wound Measurement: 1x1x.5   Wound Base: Granular/Healthy Peri-wound: Calloused Exudate: Scant/small amount Serosanguinous exudate wound without warmth, erythema, signs of acute infection  No images are attached to the encounter.  Radiographs:  X-ray of the right foot: no soft tissue emphysema, no signs of osteomyelitis Assessment:   1. Skin ulcer of right heel, limited to breakdown of skin [facility])    Plan:  Patient was evaluated and treated and all questions answered.  Ulcer right heel -XR reviewed with patient -Dressing applied consisting of silvadene and mepilex border dressing -Wound cleansed and debrided  Procedure: Selective Debridement of Wound Rationale: Removal of devitalized tissue from the wound to promote healing.  Pre-Debridement Wound Measurements: 1 cm x 1.5 cm x 0.1 cm  Post-Debridement Wound Measurements: same as pre-debridement. Type of Debridement: sharp selective Tissue Removed: Devitalized soft-tissue Dressing: Dry, sterile, compression dressing. Disposition: Patient tolerated procedure well. Patient to return in 1 week for follow-up.  Return in about 1 month (around 09/30/2019) for Wound Care, Right.

## 2019-09-02 ENCOUNTER — Other Ambulatory Visit: Payer: Self-pay | Admitting: Podiatry

## 2019-09-02 DIAGNOSIS — L97511 Non-pressure chronic ulcer of other part of right foot limited to breakdown of skin: Secondary | ICD-10-CM

## 2019-09-07 DIAGNOSIS — E785 Hyperlipidemia, unspecified: Secondary | ICD-10-CM | POA: Diagnosis not present

## 2019-09-07 DIAGNOSIS — J449 Chronic obstructive pulmonary disease, unspecified: Secondary | ICD-10-CM | POA: Diagnosis not present

## 2019-09-07 DIAGNOSIS — E1140 Type 2 diabetes mellitus with diabetic neuropathy, unspecified: Secondary | ICD-10-CM | POA: Diagnosis not present

## 2019-09-07 DIAGNOSIS — I4891 Unspecified atrial fibrillation: Secondary | ICD-10-CM | POA: Diagnosis not present

## 2019-09-26 DIAGNOSIS — Z5181 Encounter for therapeutic drug level monitoring: Secondary | ICD-10-CM | POA: Diagnosis not present

## 2019-09-26 DIAGNOSIS — I4891 Unspecified atrial fibrillation: Secondary | ICD-10-CM | POA: Diagnosis not present

## 2019-09-26 DIAGNOSIS — Z7901 Long term (current) use of anticoagulants: Secondary | ICD-10-CM | POA: Diagnosis not present

## 2019-10-03 ENCOUNTER — Other Ambulatory Visit: Payer: Self-pay

## 2019-10-03 ENCOUNTER — Ambulatory Visit: Payer: Medicare Other | Admitting: Podiatry

## 2019-10-03 DIAGNOSIS — L928 Other granulomatous disorders of the skin and subcutaneous tissue: Secondary | ICD-10-CM | POA: Diagnosis not present

## 2019-10-03 DIAGNOSIS — L97411 Non-pressure chronic ulcer of right heel and midfoot limited to breakdown of skin: Secondary | ICD-10-CM | POA: Diagnosis not present

## 2019-10-03 NOTE — Progress Notes (Signed)
  Subjective:  Patient ID: Chad Burgess, male    DOB: 1941/01/14,  MRN: [id number]  Chief Complaint  Patient presents with  . Wound Check    F/U Rt heel ulcer Pt. states," very sore, it hurts but the hole is smaller, but it still drainage and very painful.' -w/ drianage and redness -tp dneis swellgin tx: abx oiintment and epsom satl soaking   . Diabetes    FBS: pt does not check A1C: na PCP: Nyra Capes x Mon    79 y.o. male presents for wound care. Hx confirmed with patient. Thinks the wound is doing better.  Objective:  Physical Exam: Wound Location: right heel Wound Measurement: 2x1.5x0.2 Wound Base: Granular/Healthy Peri-wound: Calloused Exudate: Scant/small amount Serosanguinous exudate wound without warmth, erythema, signs of acute infection    Assessment:   1. 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 Skin ulcer of right heel, limited to breakdown of skin [facility])    Plan:  Patient was evaluated and treated and all questions answered.  Ulcer right heel -Wound fully granular. Debrided and cauterized with silver nitrate.  Procedure: Chemical Cauterization of Granulation Tissue Rationale: Cauterize granular wound base to promote healing.  Wound Measurements: 2 cm x  1.5 cm x 0.2 cm  Instrumentation: Silver nitrate stick x2 Dressing: Dry, sterile, compression dressing. Disposition: Patient tolerated procedure well. Patient to return in 1 week for follow-up.   No follow-ups on file.

## 2019-10-07 DIAGNOSIS — E1140 Type 2 diabetes mellitus with diabetic neuropathy, unspecified: Secondary | ICD-10-CM | POA: Diagnosis not present

## 2019-10-07 DIAGNOSIS — I4891 Unspecified atrial fibrillation: Secondary | ICD-10-CM | POA: Diagnosis not present

## 2019-10-07 DIAGNOSIS — J449 Chronic obstructive pulmonary disease, unspecified: Secondary | ICD-10-CM | POA: Diagnosis not present

## 2019-10-07 DIAGNOSIS — E785 Hyperlipidemia, unspecified: Secondary | ICD-10-CM | POA: Diagnosis not present

## 2019-10-24 DIAGNOSIS — I4891 Unspecified atrial fibrillation: Secondary | ICD-10-CM | POA: Diagnosis not present

## 2019-10-24 DIAGNOSIS — Z7901 Long term (current) use of anticoagulants: Secondary | ICD-10-CM | POA: Diagnosis not present

## 2019-10-24 DIAGNOSIS — Z5181 Encounter for therapeutic drug level monitoring: Secondary | ICD-10-CM | POA: Diagnosis not present

## 2019-10-31 ENCOUNTER — Ambulatory Visit: Payer: Medicare Other | Admitting: Podiatry

## 2019-10-31 ENCOUNTER — Other Ambulatory Visit: Payer: Self-pay

## 2019-10-31 ENCOUNTER — Telehealth: Payer: Self-pay | Admitting: *Deleted

## 2019-10-31 DIAGNOSIS — L97411 Non-pressure chronic ulcer of right heel and midfoot limited to breakdown of skin: Secondary | ICD-10-CM | POA: Diagnosis not present

## 2019-10-31 NOTE — Telephone Encounter (Signed)
-----   Message from Evelina Bucy, DPM sent at 10/31/2019  9:40 AM EDT ----- Can we order Annitta Needs

## 2019-10-31 NOTE — Progress Notes (Addendum)
  Subjective:  Patient ID: Chad Burgess, male    DOB: [birth year],  MRN: [id number]  Chief Complaint  Patient presents with  . Wound Check    F/U Rt heel wound check pt. states," it hurts all the time, Pain has been 8-9/10. We think it looks worse but some smaller." tx; abx ointment and bandage -w/ swelling and more driange -pt dneis redness  . Diabetes    FBS; not check A1C: unkown     79 y.o. male presents for wound care. Hx confirmed with patient.  Objective:  Physical Exam: Wound Location: right heel Wound Measurement: 2x3 post-debridement Wound Base: Mixed Granular/Fibrotic Peri-wound: Calloused Exudate: Moderate amount Serosanguinous exudate wound without warmth, erythema, signs of acute infection  Assessment:   1. Skin ulcer of right heel, limited to breakdown of skin [facility])    Plan:  Patient was evaluated and treated and all questions answered.  Ulcer Right heel -Dressing applied consisting of prisma and mepilex border heel -Offload ulcer with heel off-loading shoe. Will order. Could not tolerate boot. -Wound cleansed and debrided -Order Santyl  Procedure: Excisional Debridement of Wound Indication: Removal of non-viable soft tissue from the wound to promote healing.  Anesthesia: none Pre-Debridement Wound Measurements: 2 cm x 2.5 cm x 0.3 cm  Post-Debridement Wound Measurements: 2 cm x 3 cm x 0.3 cm  Type of Debridement: Sharp Excisional Tissue Removed: Non-viable soft tissue Instrumentation: 15 blade and tissue nipper Depth of Debridement: subcutaneous tissue. Technique: Sharp excisional debridement to bleeding, viable wound base.  Dressing: Dry, sterile, compression dressing. Disposition: Patient tolerated procedure well. Patient to return in 1 week for follow-up.    No follow-ups on file.

## 2019-11-01 MED ORDER — SANTYL 250 UNIT/GM EX OINT: 1.0000 "application " | TOPICAL_OINTMENT | Freq: Every day | CUTANEOUS | 5 refills | Status: DC

## 2019-11-01 NOTE — Telephone Encounter (Signed)
-----   Message from Evelina Bucy, DPM sent at 10/31/2019  9:40 AM EDT ----- Can we order Annitta Needs

## 2019-11-01 NOTE — Telephone Encounter (Signed)
Faxed required form, LOV clinical, and demographics to [facility].

## 2019-11-04 ENCOUNTER — Telehealth: Payer: Self-pay

## 2019-11-04 NOTE — Telephone Encounter (Signed)
Called pt and LVM to let him know we have the darco shoe in stock for him to pick up

## 2019-11-07 DIAGNOSIS — E1140 Type 2 diabetes mellitus with diabetic neuropathy, unspecified: Secondary | ICD-10-CM | POA: Diagnosis not present

## 2019-11-07 DIAGNOSIS — J449 Chronic obstructive pulmonary disease, unspecified: Secondary | ICD-10-CM | POA: Diagnosis not present

## 2019-11-07 DIAGNOSIS — E785 Hyperlipidemia, unspecified: Secondary | ICD-10-CM | POA: Diagnosis not present

## 2019-11-07 DIAGNOSIS — I4891 Unspecified atrial fibrillation: Secondary | ICD-10-CM | POA: Diagnosis not present

## 2019-11-09 DIAGNOSIS — M79671 Pain in right foot: Secondary | ICD-10-CM | POA: Diagnosis not present

## 2019-11-09 DIAGNOSIS — I10 Essential (primary) hypertension: Secondary | ICD-10-CM | POA: Diagnosis not present

## 2019-11-09 DIAGNOSIS — Z7901 Long term (current) use of anticoagulants: Secondary | ICD-10-CM | POA: Diagnosis not present

## 2019-11-09 DIAGNOSIS — E11621 Type 2 diabetes mellitus with foot ulcer: Secondary | ICD-10-CM | POA: Diagnosis not present

## 2019-11-09 DIAGNOSIS — S91301A Unspecified open wound, right foot, initial encounter: Secondary | ICD-10-CM | POA: Diagnosis not present

## 2019-11-09 DIAGNOSIS — L97529 Non-pressure chronic ulcer of other part of left foot with unspecified severity: Secondary | ICD-10-CM | POA: Diagnosis not present

## 2019-11-09 DIAGNOSIS — M79672 Pain in left foot: Secondary | ICD-10-CM | POA: Diagnosis not present

## 2019-11-09 DIAGNOSIS — Z794 Long term (current) use of insulin: Secondary | ICD-10-CM | POA: Diagnosis not present

## 2019-11-09 DIAGNOSIS — Z79899 Other long term (current) drug therapy: Secondary | ICD-10-CM | POA: Diagnosis not present

## 2019-11-09 DIAGNOSIS — L97519 Non-pressure chronic ulcer of other part of right foot with unspecified severity: Secondary | ICD-10-CM | POA: Diagnosis not present

## 2019-11-11 DIAGNOSIS — S91309A Unspecified open wound, unspecified foot, initial encounter: Secondary | ICD-10-CM | POA: Diagnosis not present

## 2019-11-15 ENCOUNTER — Ambulatory Visit: Payer: Medicare Other | Admitting: Podiatry

## 2019-11-15 DIAGNOSIS — S91301A Unspecified open wound, right foot, initial encounter: Secondary | ICD-10-CM | POA: Diagnosis not present

## 2019-11-15 HISTORY — DX: Unspecified open wound, right foot, initial encounter: S91.301A

## 2019-11-28 ENCOUNTER — Other Ambulatory Visit: Payer: Self-pay

## 2019-11-28 ENCOUNTER — Ambulatory Visit: Payer: Medicare Other | Admitting: Podiatry

## 2019-11-28 ENCOUNTER — Ambulatory Visit (INDEPENDENT_AMBULATORY_CARE_PROVIDER_SITE_OTHER): Payer: Medicare Other

## 2019-11-28 DIAGNOSIS — L97413 Non-pressure chronic ulcer of right heel and midfoot with necrosis of muscle: Secondary | ICD-10-CM

## 2019-11-28 DIAGNOSIS — L97411 Non-pressure chronic ulcer of right heel and midfoot limited to breakdown of skin: Secondary | ICD-10-CM | POA: Diagnosis not present

## 2019-11-28 DIAGNOSIS — N390 Urinary tract infection, site not specified: Secondary | ICD-10-CM | POA: Diagnosis not present

## 2019-11-28 DIAGNOSIS — I4891 Unspecified atrial fibrillation: Secondary | ICD-10-CM | POA: Diagnosis not present

## 2019-11-28 DIAGNOSIS — N200 Calculus of kidney: Secondary | ICD-10-CM | POA: Diagnosis not present

## 2019-11-28 DIAGNOSIS — J9811 Atelectasis: Secondary | ICD-10-CM | POA: Diagnosis not present

## 2019-11-28 DIAGNOSIS — A499 Bacterial infection, unspecified: Secondary | ICD-10-CM | POA: Diagnosis not present

## 2019-11-28 DIAGNOSIS — N179 Acute kidney failure, unspecified: Secondary | ICD-10-CM | POA: Diagnosis not present

## 2019-11-28 DIAGNOSIS — I951 Orthostatic hypotension: Secondary | ICD-10-CM | POA: Diagnosis not present

## 2019-11-28 DIAGNOSIS — E860 Dehydration: Secondary | ICD-10-CM | POA: Diagnosis not present

## 2019-11-28 DIAGNOSIS — E1169 Type 2 diabetes mellitus with other specified complication: Secondary | ICD-10-CM | POA: Diagnosis not present

## 2019-11-28 DIAGNOSIS — R5383 Other fatigue: Secondary | ICD-10-CM | POA: Diagnosis not present

## 2019-11-28 DIAGNOSIS — I700 Atherosclerosis of aorta: Secondary | ICD-10-CM | POA: Diagnosis not present

## 2019-11-28 DIAGNOSIS — R531 Weakness: Secondary | ICD-10-CM | POA: Diagnosis not present

## 2019-11-28 DIAGNOSIS — D72829 Elevated white blood cell count, unspecified: Secondary | ICD-10-CM | POA: Diagnosis not present

## 2019-11-28 DIAGNOSIS — E871 Hypo-osmolality and hyponatremia: Secondary | ICD-10-CM | POA: Diagnosis not present

## 2019-11-29 ENCOUNTER — Telehealth: Payer: Self-pay

## 2019-11-29 DIAGNOSIS — M109 Gout, unspecified: Secondary | ICD-10-CM | POA: Diagnosis not present

## 2019-11-29 DIAGNOSIS — I700 Atherosclerosis of aorta: Secondary | ICD-10-CM | POA: Diagnosis not present

## 2019-11-29 DIAGNOSIS — J9811 Atelectasis: Secondary | ICD-10-CM | POA: Diagnosis not present

## 2019-11-29 DIAGNOSIS — E1169 Type 2 diabetes mellitus with other specified complication: Secondary | ICD-10-CM | POA: Diagnosis not present

## 2019-11-29 DIAGNOSIS — R5381 Other malaise: Secondary | ICD-10-CM | POA: Diagnosis not present

## 2019-11-29 DIAGNOSIS — L97419 Non-pressure chronic ulcer of right heel and midfoot with unspecified severity: Secondary | ICD-10-CM | POA: Diagnosis not present

## 2019-11-29 DIAGNOSIS — A419 Sepsis, unspecified organism: Secondary | ICD-10-CM | POA: Diagnosis not present

## 2019-11-29 DIAGNOSIS — E871 Hypo-osmolality and hyponatremia: Secondary | ICD-10-CM | POA: Diagnosis not present

## 2019-11-29 DIAGNOSIS — E785 Hyperlipidemia, unspecified: Secondary | ICD-10-CM | POA: Diagnosis not present

## 2019-11-29 DIAGNOSIS — Z7901 Long term (current) use of anticoagulants: Secondary | ICD-10-CM | POA: Diagnosis not present

## 2019-11-29 DIAGNOSIS — E11621 Type 2 diabetes mellitus with foot ulcer: Secondary | ICD-10-CM | POA: Diagnosis not present

## 2019-11-29 DIAGNOSIS — R791 Abnormal coagulation profile: Secondary | ICD-10-CM | POA: Diagnosis not present

## 2019-11-29 DIAGNOSIS — N179 Acute kidney failure, unspecified: Secondary | ICD-10-CM | POA: Diagnosis not present

## 2019-11-29 DIAGNOSIS — N390 Urinary tract infection, site not specified: Secondary | ICD-10-CM | POA: Diagnosis not present

## 2019-11-29 DIAGNOSIS — Z452 Encounter for adjustment and management of vascular access device: Secondary | ICD-10-CM | POA: Diagnosis not present

## 2019-11-29 DIAGNOSIS — T45515A Adverse effect of anticoagulants, initial encounter: Secondary | ICD-10-CM | POA: Diagnosis not present

## 2019-11-29 DIAGNOSIS — B358 Other dermatophytoses: Secondary | ICD-10-CM | POA: Diagnosis not present

## 2019-11-29 DIAGNOSIS — S91301A Unspecified open wound, right foot, initial encounter: Secondary | ICD-10-CM | POA: Diagnosis not present

## 2019-11-29 DIAGNOSIS — M868X7 Other osteomyelitis, ankle and foot: Secondary | ICD-10-CM | POA: Diagnosis not present

## 2019-11-29 DIAGNOSIS — R531 Weakness: Secondary | ICD-10-CM | POA: Diagnosis not present

## 2019-11-29 DIAGNOSIS — H9190 Unspecified hearing loss, unspecified ear: Secondary | ICD-10-CM | POA: Diagnosis not present

## 2019-11-29 DIAGNOSIS — I482 Chronic atrial fibrillation, unspecified: Secondary | ICD-10-CM | POA: Diagnosis not present

## 2019-11-29 DIAGNOSIS — E876 Hypokalemia: Secondary | ICD-10-CM | POA: Diagnosis not present

## 2019-11-29 DIAGNOSIS — E860 Dehydration: Secondary | ICD-10-CM | POA: Diagnosis not present

## 2019-11-29 DIAGNOSIS — N200 Calculus of kidney: Secondary | ICD-10-CM | POA: Diagnosis not present

## 2019-11-29 DIAGNOSIS — I739 Peripheral vascular disease, unspecified: Secondary | ICD-10-CM | POA: Diagnosis not present

## 2019-11-29 DIAGNOSIS — R5383 Other fatigue: Secondary | ICD-10-CM | POA: Diagnosis not present

## 2019-11-29 DIAGNOSIS — L97519 Non-pressure chronic ulcer of other part of right foot with unspecified severity: Secondary | ICD-10-CM | POA: Diagnosis not present

## 2019-11-29 DIAGNOSIS — D72829 Elevated white blood cell count, unspecified: Secondary | ICD-10-CM | POA: Diagnosis not present

## 2019-11-29 NOTE — Telephone Encounter (Signed)
Pt's wife called and LVM stating pt was admitted to [facility] yesterday by PCP

## 2019-11-30 NOTE — Telephone Encounter (Signed)
Patient was seen in the hosptial 

## 2019-12-01 ENCOUNTER — Telehealth: Payer: Self-pay | Admitting: *Deleted

## 2019-12-01 ENCOUNTER — Telehealth: Payer: Self-pay | Admitting: Podiatry

## 2019-12-01 ENCOUNTER — Encounter: Payer: Self-pay | Admitting: Podiatry

## 2019-12-01 NOTE — Telephone Encounter (Signed)
Pt daughter Leitha Bleak  was returning call to [place] she asked if you would call back

## 2019-12-01 NOTE — Telephone Encounter (Signed)
Dr. March Rummage requested KCI*109M wound vac paperwork, and demographics to be faxed to [facility] Attn:  Case manager. Faxed requested paperwork to [facility] Attn: case manager.

## 2019-12-02 ENCOUNTER — Ambulatory Visit: Payer: Medicare Other | Admitting: Sports Medicine

## 2019-12-05 ENCOUNTER — Ambulatory Visit: Payer: Medicare Other | Admitting: Podiatry

## 2019-12-06 ENCOUNTER — Encounter: Payer: Self-pay | Admitting: Sports Medicine

## 2019-12-07 ENCOUNTER — Telehealth: Payer: Self-pay

## 2019-12-07 DIAGNOSIS — Z7901 Long term (current) use of anticoagulants: Secondary | ICD-10-CM | POA: Diagnosis not present

## 2019-12-07 DIAGNOSIS — M109 Gout, unspecified: Secondary | ICD-10-CM | POA: Diagnosis not present

## 2019-12-07 DIAGNOSIS — M86171 Other acute osteomyelitis, right ankle and foot: Secondary | ICD-10-CM | POA: Diagnosis not present

## 2019-12-07 DIAGNOSIS — E1169 Type 2 diabetes mellitus with other specified complication: Secondary | ICD-10-CM | POA: Diagnosis not present

## 2019-12-07 DIAGNOSIS — Z8673 Personal history of transient ischemic attack (TIA), and cerebral infarction without residual deficits: Secondary | ICD-10-CM | POA: Diagnosis not present

## 2019-12-07 DIAGNOSIS — I10 Essential (primary) hypertension: Secondary | ICD-10-CM | POA: Diagnosis not present

## 2019-12-07 DIAGNOSIS — I951 Orthostatic hypotension: Secondary | ICD-10-CM | POA: Diagnosis not present

## 2019-12-07 DIAGNOSIS — J9811 Atelectasis: Secondary | ICD-10-CM | POA: Diagnosis not present

## 2019-12-07 DIAGNOSIS — Z792 Long term (current) use of antibiotics: Secondary | ICD-10-CM | POA: Diagnosis not present

## 2019-12-07 DIAGNOSIS — E785 Hyperlipidemia, unspecified: Secondary | ICD-10-CM | POA: Diagnosis not present

## 2019-12-07 DIAGNOSIS — L97416 Non-pressure chronic ulcer of right heel and midfoot with bone involvement without evidence of necrosis: Secondary | ICD-10-CM | POA: Diagnosis not present

## 2019-12-07 DIAGNOSIS — J449 Chronic obstructive pulmonary disease, unspecified: Secondary | ICD-10-CM | POA: Diagnosis not present

## 2019-12-07 DIAGNOSIS — Z7982 Long term (current) use of aspirin: Secondary | ICD-10-CM | POA: Diagnosis not present

## 2019-12-07 DIAGNOSIS — Z452 Encounter for adjustment and management of vascular access device: Secondary | ICD-10-CM | POA: Diagnosis not present

## 2019-12-07 DIAGNOSIS — E1151 Type 2 diabetes mellitus with diabetic peripheral angiopathy without gangrene: Secondary | ICD-10-CM | POA: Diagnosis not present

## 2019-12-07 DIAGNOSIS — I482 Chronic atrial fibrillation, unspecified: Secondary | ICD-10-CM | POA: Diagnosis not present

## 2019-12-07 DIAGNOSIS — L97411 Non-pressure chronic ulcer of right heel and midfoot limited to breakdown of skin: Secondary | ICD-10-CM | POA: Diagnosis not present

## 2019-12-07 DIAGNOSIS — I4891 Unspecified atrial fibrillation: Secondary | ICD-10-CM | POA: Diagnosis not present

## 2019-12-07 DIAGNOSIS — E11621 Type 2 diabetes mellitus with foot ulcer: Secondary | ICD-10-CM | POA: Diagnosis not present

## 2019-12-07 NOTE — Telephone Encounter (Signed)
Sharyn Lull from [facility] called and LVM at the nurse line stating/ requesting wound orders for pt. Sharyn Lull states if they can see the pt 2-3 times/week for picc line and wound vac change? Sharyn Lull would also like to know which Dr. Lynnda Child be signing orders.

## 2019-12-07 NOTE — Telephone Encounter (Signed)
I will sign the orders Please see patient 3x per wk on a schedule of M/W/F for wound vac dressing changes to the right heel. When applying the vac if there is any periwound maceration may use betadine to this area or skin prep Continue with picc line care and weekly cbc, chem 7, esr, and sed rate while on antibiotics Thanks Dr. Chauncey Cruel

## 2019-12-08 NOTE — Telephone Encounter (Signed)
Spoke with Sharyn Lull -RN from [facility] and advised her Dr. Leeanne Rio wound care orders for Mr. Trier. I advised Sharyn Lull that per Dr. Cannon Kettle they can see pt 3 x /wk on a schedule of M/W/F for the wound vac and if there is any periwound maceration they can app;y betadine to the are or skin prep. Also I advised Sharyn Lull that per Dr. Cannon Kettle continue with the picc line care and perform weekly cbc, chem 7, esr and se rate while pt is on the abx. Sharyn Lull stated understanding of wound care orders

## 2019-12-09 DIAGNOSIS — E1151 Type 2 diabetes mellitus with diabetic peripheral angiopathy without gangrene: Secondary | ICD-10-CM | POA: Diagnosis not present

## 2019-12-09 DIAGNOSIS — Z452 Encounter for adjustment and management of vascular access device: Secondary | ICD-10-CM | POA: Diagnosis not present

## 2019-12-09 DIAGNOSIS — E11621 Type 2 diabetes mellitus with foot ulcer: Secondary | ICD-10-CM | POA: Diagnosis not present

## 2019-12-09 DIAGNOSIS — I482 Chronic atrial fibrillation, unspecified: Secondary | ICD-10-CM | POA: Diagnosis not present

## 2019-12-09 DIAGNOSIS — Z7982 Long term (current) use of aspirin: Secondary | ICD-10-CM | POA: Diagnosis not present

## 2019-12-09 DIAGNOSIS — Z7901 Long term (current) use of anticoagulants: Secondary | ICD-10-CM | POA: Diagnosis not present

## 2019-12-09 DIAGNOSIS — Z8673 Personal history of transient ischemic attack (TIA), and cerebral infarction without residual deficits: Secondary | ICD-10-CM | POA: Diagnosis not present

## 2019-12-09 DIAGNOSIS — J9811 Atelectasis: Secondary | ICD-10-CM | POA: Diagnosis not present

## 2019-12-09 DIAGNOSIS — I10 Essential (primary) hypertension: Secondary | ICD-10-CM | POA: Diagnosis not present

## 2019-12-09 DIAGNOSIS — Z792 Long term (current) use of antibiotics: Secondary | ICD-10-CM | POA: Diagnosis not present

## 2019-12-09 DIAGNOSIS — M109 Gout, unspecified: Secondary | ICD-10-CM | POA: Diagnosis not present

## 2019-12-09 DIAGNOSIS — E1169 Type 2 diabetes mellitus with other specified complication: Secondary | ICD-10-CM | POA: Diagnosis not present

## 2019-12-09 DIAGNOSIS — M86171 Other acute osteomyelitis, right ankle and foot: Secondary | ICD-10-CM | POA: Diagnosis not present

## 2019-12-09 DIAGNOSIS — E785 Hyperlipidemia, unspecified: Secondary | ICD-10-CM | POA: Diagnosis not present

## 2019-12-09 DIAGNOSIS — L97416 Non-pressure chronic ulcer of right heel and midfoot with bone involvement without evidence of necrosis: Secondary | ICD-10-CM | POA: Diagnosis not present

## 2019-12-11 DIAGNOSIS — J9811 Atelectasis: Secondary | ICD-10-CM | POA: Diagnosis not present

## 2019-12-11 DIAGNOSIS — Z8673 Personal history of transient ischemic attack (TIA), and cerebral infarction without residual deficits: Secondary | ICD-10-CM | POA: Diagnosis not present

## 2019-12-11 DIAGNOSIS — M86171 Other acute osteomyelitis, right ankle and foot: Secondary | ICD-10-CM | POA: Diagnosis not present

## 2019-12-11 DIAGNOSIS — Z7982 Long term (current) use of aspirin: Secondary | ICD-10-CM | POA: Diagnosis not present

## 2019-12-11 DIAGNOSIS — Z7901 Long term (current) use of anticoagulants: Secondary | ICD-10-CM | POA: Diagnosis not present

## 2019-12-11 DIAGNOSIS — E11621 Type 2 diabetes mellitus with foot ulcer: Secondary | ICD-10-CM | POA: Diagnosis not present

## 2019-12-11 DIAGNOSIS — Z452 Encounter for adjustment and management of vascular access device: Secondary | ICD-10-CM | POA: Diagnosis not present

## 2019-12-11 DIAGNOSIS — M109 Gout, unspecified: Secondary | ICD-10-CM | POA: Diagnosis not present

## 2019-12-11 DIAGNOSIS — Z792 Long term (current) use of antibiotics: Secondary | ICD-10-CM | POA: Diagnosis not present

## 2019-12-11 DIAGNOSIS — E785 Hyperlipidemia, unspecified: Secondary | ICD-10-CM | POA: Diagnosis not present

## 2019-12-11 DIAGNOSIS — I482 Chronic atrial fibrillation, unspecified: Secondary | ICD-10-CM | POA: Diagnosis not present

## 2019-12-11 DIAGNOSIS — L97416 Non-pressure chronic ulcer of right heel and midfoot with bone involvement without evidence of necrosis: Secondary | ICD-10-CM | POA: Diagnosis not present

## 2019-12-11 DIAGNOSIS — E1169 Type 2 diabetes mellitus with other specified complication: Secondary | ICD-10-CM | POA: Diagnosis not present

## 2019-12-11 DIAGNOSIS — L97411 Non-pressure chronic ulcer of right heel and midfoot limited to breakdown of skin: Secondary | ICD-10-CM | POA: Diagnosis not present

## 2019-12-11 DIAGNOSIS — E1151 Type 2 diabetes mellitus with diabetic peripheral angiopathy without gangrene: Secondary | ICD-10-CM | POA: Diagnosis not present

## 2019-12-11 DIAGNOSIS — I10 Essential (primary) hypertension: Secondary | ICD-10-CM | POA: Diagnosis not present

## 2019-12-11 NOTE — Telephone Encounter (Signed)
Erroneous

## 2019-12-11 NOTE — Progress Notes (Signed)
°  Subjective:  Patient ID: Chad Burgess, male    DOB: [birth year],  MRN: [id number]  Chief Complaint  Patient presents with   Foot Ulcer    F/U Rt bottom heel ulcer Pt. states," there's a lot of fluid coming out and it's bigger now. Also, a skin came off from the bottom and it has an odor ot it." -w/ pain and redness -raw and macerated Tx: sx shoe, cane, abx cream and dressign -pt stats offloading ulcer sx shoe thrrew him backwards and hit his head on the back -pt states he was dx with an infection on his kidney today and was rx and abx by mouth    79 y.o. male presents for wound care. Hx confirmed with patient.  Objective:  Physical Exam: Wound Location: right heel Wound Measurement: 3x4post-debridement Wound Base: Mixed Granular/Fibrotic Peri-wound: Calloused Exudate: Moderate amount Serosanguinous exudate wound without warmth, erythema, signs of acute infection. The wound does probe deep almost to bone.  Assessment:   1. Skin ulcer of right heel with necrosis of muscle ([place])    Plan:  Patient was evaluated and treated and all questions answered.  Ulcer Right heel -Offload ulcer with surgical shoe, could not tolerate boot or heel offloading shoe. -Would benefit from SNAP VAC -Will need a wheelchair for safe nonweightbearing.  He is unable to tolerate the boot and hit his head -XR taken without evidence of osteomyelitis, may need MRI for further eval given depth of the wound.  Procedure: Mechanical Wound VAC Application Location: right heel Wound Measurement: 3 cm x 4 cm x 0.3 cm  Technique: Blue foam to wound base, followed by hydrocolloid 0-Ring, followed by hydrocolloid dressing. Plunger maximally depressed with with good seal noted. Disposition: Patient tolerated procedure well.   No follow-ups on file.

## 2019-12-12 DIAGNOSIS — M109 Gout, unspecified: Secondary | ICD-10-CM | POA: Diagnosis not present

## 2019-12-12 DIAGNOSIS — I482 Chronic atrial fibrillation, unspecified: Secondary | ICD-10-CM | POA: Diagnosis not present

## 2019-12-12 DIAGNOSIS — M86171 Other acute osteomyelitis, right ankle and foot: Secondary | ICD-10-CM | POA: Diagnosis not present

## 2019-12-12 DIAGNOSIS — Z7901 Long term (current) use of anticoagulants: Secondary | ICD-10-CM | POA: Diagnosis not present

## 2019-12-12 DIAGNOSIS — L97416 Non-pressure chronic ulcer of right heel and midfoot with bone involvement without evidence of necrosis: Secondary | ICD-10-CM | POA: Diagnosis not present

## 2019-12-12 DIAGNOSIS — E785 Hyperlipidemia, unspecified: Secondary | ICD-10-CM | POA: Diagnosis not present

## 2019-12-12 DIAGNOSIS — Z792 Long term (current) use of antibiotics: Secondary | ICD-10-CM | POA: Diagnosis not present

## 2019-12-12 DIAGNOSIS — Z452 Encounter for adjustment and management of vascular access device: Secondary | ICD-10-CM | POA: Diagnosis not present

## 2019-12-12 DIAGNOSIS — Z8673 Personal history of transient ischemic attack (TIA), and cerebral infarction without residual deficits: Secondary | ICD-10-CM | POA: Diagnosis not present

## 2019-12-12 DIAGNOSIS — Z7982 Long term (current) use of aspirin: Secondary | ICD-10-CM | POA: Diagnosis not present

## 2019-12-12 DIAGNOSIS — E1169 Type 2 diabetes mellitus with other specified complication: Secondary | ICD-10-CM | POA: Diagnosis not present

## 2019-12-12 DIAGNOSIS — J9811 Atelectasis: Secondary | ICD-10-CM | POA: Diagnosis not present

## 2019-12-12 DIAGNOSIS — E1151 Type 2 diabetes mellitus with diabetic peripheral angiopathy without gangrene: Secondary | ICD-10-CM | POA: Diagnosis not present

## 2019-12-12 DIAGNOSIS — I10 Essential (primary) hypertension: Secondary | ICD-10-CM | POA: Diagnosis not present

## 2019-12-12 DIAGNOSIS — E11621 Type 2 diabetes mellitus with foot ulcer: Secondary | ICD-10-CM | POA: Diagnosis not present

## 2019-12-13 DIAGNOSIS — E11621 Type 2 diabetes mellitus with foot ulcer: Secondary | ICD-10-CM | POA: Diagnosis not present

## 2019-12-13 DIAGNOSIS — J9811 Atelectasis: Secondary | ICD-10-CM | POA: Diagnosis not present

## 2019-12-13 DIAGNOSIS — E1151 Type 2 diabetes mellitus with diabetic peripheral angiopathy without gangrene: Secondary | ICD-10-CM | POA: Diagnosis not present

## 2019-12-13 DIAGNOSIS — Z8673 Personal history of transient ischemic attack (TIA), and cerebral infarction without residual deficits: Secondary | ICD-10-CM | POA: Diagnosis not present

## 2019-12-13 DIAGNOSIS — Z7982 Long term (current) use of aspirin: Secondary | ICD-10-CM | POA: Diagnosis not present

## 2019-12-13 DIAGNOSIS — L97416 Non-pressure chronic ulcer of right heel and midfoot with bone involvement without evidence of necrosis: Secondary | ICD-10-CM | POA: Diagnosis not present

## 2019-12-13 DIAGNOSIS — I482 Chronic atrial fibrillation, unspecified: Secondary | ICD-10-CM | POA: Diagnosis not present

## 2019-12-13 DIAGNOSIS — E785 Hyperlipidemia, unspecified: Secondary | ICD-10-CM | POA: Diagnosis not present

## 2019-12-13 DIAGNOSIS — E1169 Type 2 diabetes mellitus with other specified complication: Secondary | ICD-10-CM | POA: Diagnosis not present

## 2019-12-13 DIAGNOSIS — Z452 Encounter for adjustment and management of vascular access device: Secondary | ICD-10-CM | POA: Diagnosis not present

## 2019-12-13 DIAGNOSIS — M109 Gout, unspecified: Secondary | ICD-10-CM | POA: Diagnosis not present

## 2019-12-13 DIAGNOSIS — I10 Essential (primary) hypertension: Secondary | ICD-10-CM | POA: Diagnosis not present

## 2019-12-13 DIAGNOSIS — Z792 Long term (current) use of antibiotics: Secondary | ICD-10-CM | POA: Diagnosis not present

## 2019-12-13 DIAGNOSIS — Z7901 Long term (current) use of anticoagulants: Secondary | ICD-10-CM | POA: Diagnosis not present

## 2019-12-13 DIAGNOSIS — M86171 Other acute osteomyelitis, right ankle and foot: Secondary | ICD-10-CM | POA: Diagnosis not present

## 2019-12-14 ENCOUNTER — Encounter: Payer: Self-pay | Admitting: Podiatry

## 2019-12-14 ENCOUNTER — Other Ambulatory Visit: Payer: Self-pay

## 2019-12-14 ENCOUNTER — Ambulatory Visit: Payer: Medicare Other | Admitting: Podiatry

## 2019-12-14 VITALS — Temp 97.4°F

## 2019-12-14 DIAGNOSIS — Z452 Encounter for adjustment and management of vascular access device: Secondary | ICD-10-CM | POA: Diagnosis not present

## 2019-12-14 DIAGNOSIS — E1142 Type 2 diabetes mellitus with diabetic polyneuropathy: Secondary | ICD-10-CM | POA: Diagnosis not present

## 2019-12-14 DIAGNOSIS — Z7901 Long term (current) use of anticoagulants: Secondary | ICD-10-CM | POA: Diagnosis not present

## 2019-12-14 DIAGNOSIS — Z8673 Personal history of transient ischemic attack (TIA), and cerebral infarction without residual deficits: Secondary | ICD-10-CM | POA: Diagnosis not present

## 2019-12-14 DIAGNOSIS — L97416 Non-pressure chronic ulcer of right heel and midfoot with bone involvement without evidence of necrosis: Secondary | ICD-10-CM | POA: Diagnosis not present

## 2019-12-14 DIAGNOSIS — Z7982 Long term (current) use of aspirin: Secondary | ICD-10-CM | POA: Diagnosis not present

## 2019-12-14 DIAGNOSIS — I10 Essential (primary) hypertension: Secondary | ICD-10-CM | POA: Diagnosis not present

## 2019-12-14 DIAGNOSIS — E1169 Type 2 diabetes mellitus with other specified complication: Secondary | ICD-10-CM | POA: Diagnosis not present

## 2019-12-14 DIAGNOSIS — I482 Chronic atrial fibrillation, unspecified: Secondary | ICD-10-CM | POA: Diagnosis not present

## 2019-12-14 DIAGNOSIS — Z792 Long term (current) use of antibiotics: Secondary | ICD-10-CM | POA: Diagnosis not present

## 2019-12-14 DIAGNOSIS — E785 Hyperlipidemia, unspecified: Secondary | ICD-10-CM | POA: Diagnosis not present

## 2019-12-14 DIAGNOSIS — M109 Gout, unspecified: Secondary | ICD-10-CM | POA: Diagnosis not present

## 2019-12-14 DIAGNOSIS — M86171 Other acute osteomyelitis, right ankle and foot: Secondary | ICD-10-CM | POA: Diagnosis not present

## 2019-12-14 DIAGNOSIS — L97413 Non-pressure chronic ulcer of right heel and midfoot with necrosis of muscle: Secondary | ICD-10-CM | POA: Diagnosis not present

## 2019-12-14 DIAGNOSIS — E1151 Type 2 diabetes mellitus with diabetic peripheral angiopathy without gangrene: Secondary | ICD-10-CM | POA: Diagnosis not present

## 2019-12-14 DIAGNOSIS — E11621 Type 2 diabetes mellitus with foot ulcer: Secondary | ICD-10-CM | POA: Diagnosis not present

## 2019-12-14 DIAGNOSIS — J9811 Atelectasis: Secondary | ICD-10-CM | POA: Diagnosis not present

## 2019-12-14 LAB — PROTIME-INR

## 2019-12-14 NOTE — Patient Instructions (Addendum)
WOUND CARE INSTRUCTIONS: resume wound VAC NPWT on 12/16/19 with white sponge 1-2 cm into tunnel/sinus/hole and over the remaining wound bed to fill in sinus tract. Continuous therapy at 115mmHg. Change 3x weekly

## 2019-12-14 NOTE — Progress Notes (Signed)
  Subjective:  Patient ID: Chad Burgess, male    DOB: 1940/07/01,  MRN: [id number]  Chief Complaint  Patient presents with  . Wound Check    R bottom of heel.     79 y.o. male presents with the above complaint. History confirmed with patient.  His home health nurse was concerned because she felt that there was a new sinus tract through the wound.  He is still taking Ertapenem through his PICC line.  He is here today with his wife.  He notes no fevers chills or nausea vomiting or pain.  Has had a wound VAC on which he presents with today.  Objective:  Physical Exam: warm, good capillary refill and reduced sensation at right lower extremity diffusely.  Right Foot: Full-thickness ulceration with complete granular wound bed measuring 3 cm x 4 cm x 0.2 cm, there is at the 12 o'clock position a sinus tract which goes to a depth of 3 cm, unable to feel Bodian feel.  Serous drainage.  No malodor,.  No purulence.  No cellulitis.  Radiographs: Not indicated today  Assessment:   1. Skin ulcer of right heel with necrosis of muscle ([place])   2. Type 2 diabetes mellitus with diabetic polyneuropathy, without long-term current use of insulin ([place])   3. Long term current use of anticoagulant      Plan:  Patient was evaluated and treated and all questions answered.  This wound has developed a sinus tract as noted above.  He is without signs of infection today.  No indication for radiographs or further advanced imaging.  No indication for further lab work at this time.  He should continue his antibiotics through his PICC line.  No debridement was necessary today.  A "VAC holiday" was given today and no therapy was applied.  His nursing can reapply this at his next visit on Friday, 12/16/2019.  Sterile dressing of Iodosorb and gauze dressing was applied today.  Instructions were faxed to the wound care agency for continuing [facility] therapy, with the addition of a white VAC sponge packed into the sinus tract  to fill in the sinus.  He will follow up with Dr. Cannon Kettle is scheduled appointment in 2 weeks.  Lanae Crumbly, DPM 12/14/2019     Return in about 2 weeks (around 12/28/2019) for As scheduled with Dr. Cannon Kettle.

## 2019-12-16 DIAGNOSIS — Z7982 Long term (current) use of aspirin: Secondary | ICD-10-CM | POA: Diagnosis not present

## 2019-12-16 DIAGNOSIS — I10 Essential (primary) hypertension: Secondary | ICD-10-CM | POA: Diagnosis not present

## 2019-12-16 DIAGNOSIS — M109 Gout, unspecified: Secondary | ICD-10-CM | POA: Diagnosis not present

## 2019-12-16 DIAGNOSIS — Z7901 Long term (current) use of anticoagulants: Secondary | ICD-10-CM | POA: Diagnosis not present

## 2019-12-16 DIAGNOSIS — E11621 Type 2 diabetes mellitus with foot ulcer: Secondary | ICD-10-CM | POA: Diagnosis not present

## 2019-12-16 DIAGNOSIS — M86171 Other acute osteomyelitis, right ankle and foot: Secondary | ICD-10-CM | POA: Diagnosis not present

## 2019-12-16 DIAGNOSIS — E1169 Type 2 diabetes mellitus with other specified complication: Secondary | ICD-10-CM | POA: Diagnosis not present

## 2019-12-16 DIAGNOSIS — L97411 Non-pressure chronic ulcer of right heel and midfoot limited to breakdown of skin: Secondary | ICD-10-CM | POA: Diagnosis not present

## 2019-12-16 DIAGNOSIS — L97416 Non-pressure chronic ulcer of right heel and midfoot with bone involvement without evidence of necrosis: Secondary | ICD-10-CM | POA: Diagnosis not present

## 2019-12-16 DIAGNOSIS — Z452 Encounter for adjustment and management of vascular access device: Secondary | ICD-10-CM | POA: Diagnosis not present

## 2019-12-16 DIAGNOSIS — J9811 Atelectasis: Secondary | ICD-10-CM | POA: Diagnosis not present

## 2019-12-16 DIAGNOSIS — E1151 Type 2 diabetes mellitus with diabetic peripheral angiopathy without gangrene: Secondary | ICD-10-CM | POA: Diagnosis not present

## 2019-12-16 DIAGNOSIS — E785 Hyperlipidemia, unspecified: Secondary | ICD-10-CM | POA: Diagnosis not present

## 2019-12-16 DIAGNOSIS — Z792 Long term (current) use of antibiotics: Secondary | ICD-10-CM | POA: Diagnosis not present

## 2019-12-16 DIAGNOSIS — Z8673 Personal history of transient ischemic attack (TIA), and cerebral infarction without residual deficits: Secondary | ICD-10-CM | POA: Diagnosis not present

## 2019-12-16 DIAGNOSIS — I482 Chronic atrial fibrillation, unspecified: Secondary | ICD-10-CM | POA: Diagnosis not present

## 2019-12-19 ENCOUNTER — Telehealth: Payer: Self-pay

## 2019-12-19 DIAGNOSIS — I482 Chronic atrial fibrillation, unspecified: Secondary | ICD-10-CM | POA: Diagnosis not present

## 2019-12-19 DIAGNOSIS — Z7982 Long term (current) use of aspirin: Secondary | ICD-10-CM | POA: Diagnosis not present

## 2019-12-19 DIAGNOSIS — E785 Hyperlipidemia, unspecified: Secondary | ICD-10-CM | POA: Diagnosis not present

## 2019-12-19 DIAGNOSIS — J9811 Atelectasis: Secondary | ICD-10-CM | POA: Diagnosis not present

## 2019-12-19 DIAGNOSIS — E1169 Type 2 diabetes mellitus with other specified complication: Secondary | ICD-10-CM | POA: Diagnosis not present

## 2019-12-19 DIAGNOSIS — E1151 Type 2 diabetes mellitus with diabetic peripheral angiopathy without gangrene: Secondary | ICD-10-CM | POA: Diagnosis not present

## 2019-12-19 DIAGNOSIS — M109 Gout, unspecified: Secondary | ICD-10-CM | POA: Diagnosis not present

## 2019-12-19 DIAGNOSIS — M86171 Other acute osteomyelitis, right ankle and foot: Secondary | ICD-10-CM | POA: Diagnosis not present

## 2019-12-19 DIAGNOSIS — Z792 Long term (current) use of antibiotics: Secondary | ICD-10-CM | POA: Diagnosis not present

## 2019-12-19 DIAGNOSIS — L97416 Non-pressure chronic ulcer of right heel and midfoot with bone involvement without evidence of necrosis: Secondary | ICD-10-CM | POA: Diagnosis not present

## 2019-12-19 DIAGNOSIS — Z8673 Personal history of transient ischemic attack (TIA), and cerebral infarction without residual deficits: Secondary | ICD-10-CM | POA: Diagnosis not present

## 2019-12-19 DIAGNOSIS — E11621 Type 2 diabetes mellitus with foot ulcer: Secondary | ICD-10-CM | POA: Diagnosis not present

## 2019-12-19 DIAGNOSIS — I10 Essential (primary) hypertension: Secondary | ICD-10-CM | POA: Diagnosis not present

## 2019-12-19 DIAGNOSIS — Z452 Encounter for adjustment and management of vascular access device: Secondary | ICD-10-CM | POA: Diagnosis not present

## 2019-12-19 DIAGNOSIS — Z7901 Long term (current) use of anticoagulants: Secondary | ICD-10-CM | POA: Diagnosis not present

## 2019-12-19 NOTE — Telephone Encounter (Signed)
Patient may have PT as requested. I approve the verbal orders  -Dr. Chauncey Cruel

## 2019-12-19 NOTE — Telephone Encounter (Signed)
Crystal [facility] RN called concerned about a new sinus tract through the pt's wound. Crystal stated there has been a significant change on the wound, there was been a new pressure point made with the wound vac, more drainage and there has been a measurement of 3 cm in all directions. Cyrstal stated pt saw a Dr. For that same issue last week and he suggested to dc the wound vac so she did not applied the wound vac today and would like to see if there would be any new wound vac orders for the pt.

## 2019-12-19 NOTE — Telephone Encounter (Signed)
Jason from PT called requesting verbal orders for PT if they can continue 1 week for next 6 wks

## 2019-12-19 NOTE — Telephone Encounter (Signed)
Have the nurse to apply packing to the wound bed and cover with dry dressing. When he comes to office on 7/15 I will re-assess the wound to see if we need to change treatment/therapy to something else. -Dr. Chauncey Cruel

## 2019-12-20 DIAGNOSIS — Z7901 Long term (current) use of anticoagulants: Secondary | ICD-10-CM | POA: Diagnosis not present

## 2019-12-20 DIAGNOSIS — M109 Gout, unspecified: Secondary | ICD-10-CM | POA: Diagnosis not present

## 2019-12-20 DIAGNOSIS — E11621 Type 2 diabetes mellitus with foot ulcer: Secondary | ICD-10-CM | POA: Diagnosis not present

## 2019-12-20 DIAGNOSIS — Z8673 Personal history of transient ischemic attack (TIA), and cerebral infarction without residual deficits: Secondary | ICD-10-CM | POA: Diagnosis not present

## 2019-12-20 DIAGNOSIS — J9811 Atelectasis: Secondary | ICD-10-CM | POA: Diagnosis not present

## 2019-12-20 DIAGNOSIS — I482 Chronic atrial fibrillation, unspecified: Secondary | ICD-10-CM | POA: Diagnosis not present

## 2019-12-20 DIAGNOSIS — Z452 Encounter for adjustment and management of vascular access device: Secondary | ICD-10-CM | POA: Diagnosis not present

## 2019-12-20 DIAGNOSIS — E785 Hyperlipidemia, unspecified: Secondary | ICD-10-CM | POA: Diagnosis not present

## 2019-12-20 DIAGNOSIS — M86171 Other acute osteomyelitis, right ankle and foot: Secondary | ICD-10-CM | POA: Diagnosis not present

## 2019-12-20 DIAGNOSIS — Z7982 Long term (current) use of aspirin: Secondary | ICD-10-CM | POA: Diagnosis not present

## 2019-12-20 DIAGNOSIS — L97416 Non-pressure chronic ulcer of right heel and midfoot with bone involvement without evidence of necrosis: Secondary | ICD-10-CM | POA: Diagnosis not present

## 2019-12-20 DIAGNOSIS — Z792 Long term (current) use of antibiotics: Secondary | ICD-10-CM | POA: Diagnosis not present

## 2019-12-20 DIAGNOSIS — I10 Essential (primary) hypertension: Secondary | ICD-10-CM | POA: Diagnosis not present

## 2019-12-20 DIAGNOSIS — E1169 Type 2 diabetes mellitus with other specified complication: Secondary | ICD-10-CM | POA: Diagnosis not present

## 2019-12-20 DIAGNOSIS — E1151 Type 2 diabetes mellitus with diabetic peripheral angiopathy without gangrene: Secondary | ICD-10-CM | POA: Diagnosis not present

## 2019-12-20 NOTE — Telephone Encounter (Signed)
Spoke with RN about new wound care orders. Per Dr. Cannon Kettle RN is  To apply packing to the wound and cover with dry dressing. Per nurse she states she will pack the wound with quarter new guaze strips and dry dressing.

## 2019-12-20 NOTE — Telephone Encounter (Signed)
Called and spoke with Corene Cornea, per Dr. Cannon Kettle she approves verbal orders for Pt 1/wk for next 6 wks

## 2019-12-21 DIAGNOSIS — L97509 Non-pressure chronic ulcer of other part of unspecified foot with unspecified severity: Secondary | ICD-10-CM | POA: Diagnosis not present

## 2019-12-21 DIAGNOSIS — I4891 Unspecified atrial fibrillation: Secondary | ICD-10-CM | POA: Diagnosis not present

## 2019-12-21 DIAGNOSIS — E11621 Type 2 diabetes mellitus with foot ulcer: Secondary | ICD-10-CM | POA: Diagnosis not present

## 2019-12-21 DIAGNOSIS — M86171 Other acute osteomyelitis, right ankle and foot: Secondary | ICD-10-CM | POA: Diagnosis not present

## 2019-12-21 DIAGNOSIS — E871 Hypo-osmolality and hyponatremia: Secondary | ICD-10-CM | POA: Diagnosis not present

## 2019-12-21 DIAGNOSIS — E1169 Type 2 diabetes mellitus with other specified complication: Secondary | ICD-10-CM | POA: Diagnosis not present

## 2019-12-21 DIAGNOSIS — L97411 Non-pressure chronic ulcer of right heel and midfoot limited to breakdown of skin: Secondary | ICD-10-CM | POA: Diagnosis not present

## 2019-12-22 ENCOUNTER — Encounter: Payer: Self-pay | Admitting: Sports Medicine

## 2019-12-22 ENCOUNTER — Ambulatory Visit: Payer: Medicare Other | Admitting: Sports Medicine

## 2019-12-22 ENCOUNTER — Other Ambulatory Visit: Payer: Self-pay

## 2019-12-22 DIAGNOSIS — Z7901 Long term (current) use of anticoagulants: Secondary | ICD-10-CM

## 2019-12-22 DIAGNOSIS — E1142 Type 2 diabetes mellitus with diabetic polyneuropathy: Secondary | ICD-10-CM | POA: Diagnosis not present

## 2019-12-22 DIAGNOSIS — L97416 Non-pressure chronic ulcer of right heel and midfoot with bone involvement without evidence of necrosis: Secondary | ICD-10-CM | POA: Diagnosis not present

## 2019-12-22 DIAGNOSIS — E785 Hyperlipidemia, unspecified: Secondary | ICD-10-CM | POA: Diagnosis not present

## 2019-12-22 DIAGNOSIS — Z8673 Personal history of transient ischemic attack (TIA), and cerebral infarction without residual deficits: Secondary | ICD-10-CM | POA: Diagnosis not present

## 2019-12-22 DIAGNOSIS — M86171 Other acute osteomyelitis, right ankle and foot: Secondary | ICD-10-CM | POA: Diagnosis not present

## 2019-12-22 DIAGNOSIS — E11621 Type 2 diabetes mellitus with foot ulcer: Secondary | ICD-10-CM | POA: Diagnosis not present

## 2019-12-22 DIAGNOSIS — L97413 Non-pressure chronic ulcer of right heel and midfoot with necrosis of muscle: Secondary | ICD-10-CM | POA: Diagnosis not present

## 2019-12-22 DIAGNOSIS — Z452 Encounter for adjustment and management of vascular access device: Secondary | ICD-10-CM | POA: Diagnosis not present

## 2019-12-22 DIAGNOSIS — I10 Essential (primary) hypertension: Secondary | ICD-10-CM | POA: Diagnosis not present

## 2019-12-22 DIAGNOSIS — J9811 Atelectasis: Secondary | ICD-10-CM | POA: Diagnosis not present

## 2019-12-22 DIAGNOSIS — Z7982 Long term (current) use of aspirin: Secondary | ICD-10-CM | POA: Diagnosis not present

## 2019-12-22 DIAGNOSIS — E1169 Type 2 diabetes mellitus with other specified complication: Secondary | ICD-10-CM | POA: Diagnosis not present

## 2019-12-22 DIAGNOSIS — Z792 Long term (current) use of antibiotics: Secondary | ICD-10-CM | POA: Diagnosis not present

## 2019-12-22 DIAGNOSIS — I482 Chronic atrial fibrillation, unspecified: Secondary | ICD-10-CM | POA: Diagnosis not present

## 2019-12-22 DIAGNOSIS — M109 Gout, unspecified: Secondary | ICD-10-CM | POA: Diagnosis not present

## 2019-12-22 DIAGNOSIS — E1151 Type 2 diabetes mellitus with diabetic peripheral angiopathy without gangrene: Secondary | ICD-10-CM | POA: Diagnosis not present

## 2019-12-22 NOTE — Progress Notes (Signed)
Subjective: Lois Ostrom is a 79 y.o. male patient seen in office for evaluation of ulceration of the right heel.  Patient is status post discharge from [facility] and is currently on IV antibiotics.  Patient is assisted by wife this visit patient has no other pedal complaints at this time.  Patient Active Problem List   Diagnosis Date Noted  . Open wound of heel, right, initial encounter 11/15/2019  . 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 Diabetes mellitus ([place]) 07/13/2017  . HTN (hypertension) 07/13/2017  . Dyslipidemia 07/13/2017   Current Outpatient Medications on File Prior to Visit  Medication Sig Dispense Refill  . allopurinol (ZYLOPRIM) 300 MG tablet Take 1 tablet by mouth daily.    . cloNIDine (CATAPRES) 0.2 MG tablet TAKE 1 TABLET (0.2mg ) BY MOUTH EVERYDAY AT BEDTIME  4  . colchicine 0.6 MG tablet Take 1 tablet (0.6 mg total) by mouth daily. During gout flare up 90 tablet 0  . collagenase (SANTYL) ointment Apply 1 application topically daily. Right heel wound measures 2 x 3cm. 30 g 5  . digoxin (LANOXIN) 0.25 MG tablet Take 250 mcg by mouth daily.  4  . diltiazem (DILT-XR) 120 MG 24 hr capsule Take 1 capsule by mouth daily.    . ertapenem (INVANZ) IVPB Inject 1 g into the vein. Daily through PICC line    . febuxostat (ULORIC) 40 MG tablet Take 40 mg by mouth daily.    . fenofibrate 160 MG tablet Take 160 mg by mouth daily.  4  . Fluticasone-Umeclidin-Vilant (TRELEGY ELLIPTA) 100-62.5-25 MCG/INH AEPB INHALE 1 PUFF BY MOUTH DAILY    . lisinopril (PRINIVIL,ZESTRIL) 40 MG tablet Take 40 mg by mouth 2 (two) times daily.  4  . metFORMIN (GLUCOPHAGE) 1000 MG tablet Take 1,000 mg by mouth 2 (two) times daily.  1  . methylPREDNISolone (MEDROL DOSEPAK) 4 MG TBPK tablet 6 Day Taper Pack. Take as Directed. 21 tablet 0  . metoprolol tartrate (LOPRESSOR) 100 MG tablet Take 1 tablet (100mg ) by mouth once daily.  1  . traMADol (ULTRAM) 50 MG tablet Take 1 tablet by mouth every 8  (eight) hours as needed. (Patient not taking: Reported on 12/14/2019)    . triamterene-hydrochlorothiazide (DYAZIDE) 37.5-25 MG capsule Take 1 capsule by mouth daily.   1  . warfarin (COUMADIN) 10 MG tablet Take 10mg  by mouth four times weekly or as directed.  2   No current facility-administered medications on file prior to visit.   No Known Allergies  No results found for this or any previous visit (from the past 2160 hour(s)).  Objective: There were no vitals filed for this visit.  General: Patient is awake, alert, oriented x 3 and in no acute distress.  Dermatology: Skin is warm and dry bilateral with a full thickness ulceration present  Right central heel. Ulceration measures 4 cm x 5 cm x sinus tract that tunnels 6 cm at the 2:00 margin of the wound. There is a mildly keratotic border with a granular base. The ulceration does not probe to bone but tunnels significantly. There is no malodor, clear active drainage, no erythema, no edema. No acute signs of infection.   Vascular: Dorsalis Pedis pulse = 1/4 Bilateral,  Posterior Tibial pulse = 1/4 Bilateral,  Capillary Fill Time < 5 seconds  Neurologic: Protective sensation absent bilateral.  Musculosketal: There is no pain to palpation to ulcerated area at right heel.  No results for input(s): GRAMSTAIN, LABORGA in the last 8760 hours.  Assessment and Plan:  Problem List Items Addressed This Visit      Endocrine   Diabetes mellitus ([place])    Other Visit Diagnoses    Skin ulcer of right heel with necrosis of muscle ([place])    -  Primary   Long term current use of anticoagulant          -Examined patient and discussed the progression of the wound and treatment alternatives. -Cleanse ulceration and using a sterile tissue nipper minimally debrided the margins of the right heel ulcer and then utilizing a Q-tip applied packing to the draining sinus of the wound bed are covered with dry dressing.  -Advised patient that home nurse will  continue with the same and if the drainage of the wound has increased significantly since we are packing it very deeply may have to restart wound VAC with use of white sponge in the sinus track -Continue with PICC line antibiotics -Continue with heel offloading shoe -Continue with use of walker and wheelchair to minimize pressure to the right foot - Advised patient to go to the ER or return to office if the wound worsens or if constitutional symptoms are present. -Patient to return to office in 1 week for follow up care and evaluation or sooner if problems arise.  Landis Martins, DPM

## 2019-12-23 ENCOUNTER — Telehealth: Payer: Self-pay

## 2019-12-23 DIAGNOSIS — E1151 Type 2 diabetes mellitus with diabetic peripheral angiopathy without gangrene: Secondary | ICD-10-CM | POA: Diagnosis not present

## 2019-12-23 DIAGNOSIS — I482 Chronic atrial fibrillation, unspecified: Secondary | ICD-10-CM | POA: Diagnosis not present

## 2019-12-23 DIAGNOSIS — Z7901 Long term (current) use of anticoagulants: Secondary | ICD-10-CM | POA: Diagnosis not present

## 2019-12-23 DIAGNOSIS — J9811 Atelectasis: Secondary | ICD-10-CM | POA: Diagnosis not present

## 2019-12-23 DIAGNOSIS — Z792 Long term (current) use of antibiotics: Secondary | ICD-10-CM | POA: Diagnosis not present

## 2019-12-23 DIAGNOSIS — Z452 Encounter for adjustment and management of vascular access device: Secondary | ICD-10-CM | POA: Diagnosis not present

## 2019-12-23 DIAGNOSIS — I10 Essential (primary) hypertension: Secondary | ICD-10-CM | POA: Diagnosis not present

## 2019-12-23 DIAGNOSIS — Z7982 Long term (current) use of aspirin: Secondary | ICD-10-CM | POA: Diagnosis not present

## 2019-12-23 DIAGNOSIS — M86171 Other acute osteomyelitis, right ankle and foot: Secondary | ICD-10-CM | POA: Diagnosis not present

## 2019-12-23 DIAGNOSIS — E1169 Type 2 diabetes mellitus with other specified complication: Secondary | ICD-10-CM | POA: Diagnosis not present

## 2019-12-23 DIAGNOSIS — L97416 Non-pressure chronic ulcer of right heel and midfoot with bone involvement without evidence of necrosis: Secondary | ICD-10-CM | POA: Diagnosis not present

## 2019-12-23 DIAGNOSIS — M109 Gout, unspecified: Secondary | ICD-10-CM | POA: Diagnosis not present

## 2019-12-23 DIAGNOSIS — Z8673 Personal history of transient ischemic attack (TIA), and cerebral infarction without residual deficits: Secondary | ICD-10-CM | POA: Diagnosis not present

## 2019-12-23 DIAGNOSIS — E11621 Type 2 diabetes mellitus with foot ulcer: Secondary | ICD-10-CM | POA: Diagnosis not present

## 2019-12-23 DIAGNOSIS — E785 Hyperlipidemia, unspecified: Secondary | ICD-10-CM | POA: Diagnosis not present

## 2019-12-23 NOTE — Telephone Encounter (Signed)
-----   Message from Landis Martins, [place] sent at 12/22/2019 11:32 PM EDT ----- Regarding: Call Home Nurse [facility] give orders for them to continue with packing to the tracking sinus of the wound bed but be sure to pack it completely if the wound is draining through the outer layers then we will have to resume the wound VAC but they will need to use the white sponge to put in the sinus covered with the black sponge with the wound VAC set at 125 mmHg changing it twice weekly

## 2019-12-23 NOTE — Telephone Encounter (Signed)
Christella from [facility] had LVM at nurse line requesting to know if it would be fine to see pt 2x/wk the next 6 wks. -Dr. Cannon Kettle approval was given Called back Christella with approval to see pt 2x/ wk for next 6 weeks and gave additional wound care orders form Dr. Cannon Kettle.  Wound care orders sent at 12/22/19 at 11:32 PM message

## 2019-12-26 DIAGNOSIS — Z7982 Long term (current) use of aspirin: Secondary | ICD-10-CM | POA: Diagnosis not present

## 2019-12-26 DIAGNOSIS — Z452 Encounter for adjustment and management of vascular access device: Secondary | ICD-10-CM | POA: Diagnosis not present

## 2019-12-26 DIAGNOSIS — L97411 Non-pressure chronic ulcer of right heel and midfoot limited to breakdown of skin: Secondary | ICD-10-CM | POA: Diagnosis not present

## 2019-12-26 DIAGNOSIS — I482 Chronic atrial fibrillation, unspecified: Secondary | ICD-10-CM | POA: Diagnosis not present

## 2019-12-26 DIAGNOSIS — E1151 Type 2 diabetes mellitus with diabetic peripheral angiopathy without gangrene: Secondary | ICD-10-CM | POA: Diagnosis not present

## 2019-12-26 DIAGNOSIS — E785 Hyperlipidemia, unspecified: Secondary | ICD-10-CM | POA: Diagnosis not present

## 2019-12-26 DIAGNOSIS — Z792 Long term (current) use of antibiotics: Secondary | ICD-10-CM | POA: Diagnosis not present

## 2019-12-26 DIAGNOSIS — L97416 Non-pressure chronic ulcer of right heel and midfoot with bone involvement without evidence of necrosis: Secondary | ICD-10-CM | POA: Diagnosis not present

## 2019-12-26 DIAGNOSIS — J9811 Atelectasis: Secondary | ICD-10-CM | POA: Diagnosis not present

## 2019-12-26 DIAGNOSIS — I10 Essential (primary) hypertension: Secondary | ICD-10-CM | POA: Diagnosis not present

## 2019-12-26 DIAGNOSIS — M109 Gout, unspecified: Secondary | ICD-10-CM | POA: Diagnosis not present

## 2019-12-26 DIAGNOSIS — Z8673 Personal history of transient ischemic attack (TIA), and cerebral infarction without residual deficits: Secondary | ICD-10-CM | POA: Diagnosis not present

## 2019-12-26 DIAGNOSIS — M86171 Other acute osteomyelitis, right ankle and foot: Secondary | ICD-10-CM | POA: Diagnosis not present

## 2019-12-26 DIAGNOSIS — Z7901 Long term (current) use of anticoagulants: Secondary | ICD-10-CM | POA: Diagnosis not present

## 2019-12-26 DIAGNOSIS — E1169 Type 2 diabetes mellitus with other specified complication: Secondary | ICD-10-CM | POA: Diagnosis not present

## 2019-12-26 DIAGNOSIS — E11621 Type 2 diabetes mellitus with foot ulcer: Secondary | ICD-10-CM | POA: Diagnosis not present

## 2019-12-26 LAB — PROTIME-INR

## 2019-12-28 DIAGNOSIS — J9811 Atelectasis: Secondary | ICD-10-CM | POA: Diagnosis not present

## 2019-12-28 DIAGNOSIS — I482 Chronic atrial fibrillation, unspecified: Secondary | ICD-10-CM | POA: Diagnosis not present

## 2019-12-28 DIAGNOSIS — Z8673 Personal history of transient ischemic attack (TIA), and cerebral infarction without residual deficits: Secondary | ICD-10-CM | POA: Diagnosis not present

## 2019-12-28 DIAGNOSIS — Z7901 Long term (current) use of anticoagulants: Secondary | ICD-10-CM | POA: Diagnosis not present

## 2019-12-28 DIAGNOSIS — L97416 Non-pressure chronic ulcer of right heel and midfoot with bone involvement without evidence of necrosis: Secondary | ICD-10-CM | POA: Diagnosis not present

## 2019-12-28 DIAGNOSIS — E785 Hyperlipidemia, unspecified: Secondary | ICD-10-CM | POA: Diagnosis not present

## 2019-12-28 DIAGNOSIS — Z7982 Long term (current) use of aspirin: Secondary | ICD-10-CM | POA: Diagnosis not present

## 2019-12-28 DIAGNOSIS — E1151 Type 2 diabetes mellitus with diabetic peripheral angiopathy without gangrene: Secondary | ICD-10-CM | POA: Diagnosis not present

## 2019-12-28 DIAGNOSIS — Z452 Encounter for adjustment and management of vascular access device: Secondary | ICD-10-CM | POA: Diagnosis not present

## 2019-12-28 DIAGNOSIS — M86171 Other acute osteomyelitis, right ankle and foot: Secondary | ICD-10-CM | POA: Diagnosis not present

## 2019-12-28 DIAGNOSIS — M109 Gout, unspecified: Secondary | ICD-10-CM | POA: Diagnosis not present

## 2019-12-28 DIAGNOSIS — Z792 Long term (current) use of antibiotics: Secondary | ICD-10-CM | POA: Diagnosis not present

## 2019-12-28 DIAGNOSIS — I10 Essential (primary) hypertension: Secondary | ICD-10-CM | POA: Diagnosis not present

## 2019-12-28 DIAGNOSIS — E11621 Type 2 diabetes mellitus with foot ulcer: Secondary | ICD-10-CM | POA: Diagnosis not present

## 2019-12-28 DIAGNOSIS — E1169 Type 2 diabetes mellitus with other specified complication: Secondary | ICD-10-CM | POA: Diagnosis not present

## 2019-12-29 ENCOUNTER — Ambulatory Visit: Payer: Medicare Other | Admitting: Sports Medicine

## 2019-12-30 ENCOUNTER — Ambulatory Visit: Payer: Medicare Other | Admitting: Sports Medicine

## 2019-12-30 ENCOUNTER — Other Ambulatory Visit: Payer: Self-pay

## 2019-12-30 DIAGNOSIS — E1151 Type 2 diabetes mellitus with diabetic peripheral angiopathy without gangrene: Secondary | ICD-10-CM | POA: Diagnosis not present

## 2019-12-30 DIAGNOSIS — Z792 Long term (current) use of antibiotics: Secondary | ICD-10-CM | POA: Diagnosis not present

## 2019-12-30 DIAGNOSIS — E785 Hyperlipidemia, unspecified: Secondary | ICD-10-CM | POA: Diagnosis not present

## 2019-12-30 DIAGNOSIS — Z8673 Personal history of transient ischemic attack (TIA), and cerebral infarction without residual deficits: Secondary | ICD-10-CM | POA: Diagnosis not present

## 2019-12-30 DIAGNOSIS — E1142 Type 2 diabetes mellitus with diabetic polyneuropathy: Secondary | ICD-10-CM

## 2019-12-30 DIAGNOSIS — E1169 Type 2 diabetes mellitus with other specified complication: Secondary | ICD-10-CM | POA: Diagnosis not present

## 2019-12-30 DIAGNOSIS — I482 Chronic atrial fibrillation, unspecified: Secondary | ICD-10-CM | POA: Diagnosis not present

## 2019-12-30 DIAGNOSIS — L97416 Non-pressure chronic ulcer of right heel and midfoot with bone involvement without evidence of necrosis: Secondary | ICD-10-CM | POA: Diagnosis not present

## 2019-12-30 DIAGNOSIS — J9811 Atelectasis: Secondary | ICD-10-CM | POA: Diagnosis not present

## 2019-12-30 DIAGNOSIS — Z7982 Long term (current) use of aspirin: Secondary | ICD-10-CM | POA: Diagnosis not present

## 2019-12-30 DIAGNOSIS — Z7901 Long term (current) use of anticoagulants: Secondary | ICD-10-CM

## 2019-12-30 DIAGNOSIS — M109 Gout, unspecified: Secondary | ICD-10-CM | POA: Diagnosis not present

## 2019-12-30 DIAGNOSIS — L97413 Non-pressure chronic ulcer of right heel and midfoot with necrosis of muscle: Secondary | ICD-10-CM | POA: Diagnosis not present

## 2019-12-30 DIAGNOSIS — M86171 Other acute osteomyelitis, right ankle and foot: Secondary | ICD-10-CM | POA: Diagnosis not present

## 2019-12-30 DIAGNOSIS — I10 Essential (primary) hypertension: Secondary | ICD-10-CM | POA: Diagnosis not present

## 2019-12-30 DIAGNOSIS — E11621 Type 2 diabetes mellitus with foot ulcer: Secondary | ICD-10-CM | POA: Diagnosis not present

## 2019-12-30 DIAGNOSIS — Z452 Encounter for adjustment and management of vascular access device: Secondary | ICD-10-CM | POA: Diagnosis not present

## 2019-12-31 ENCOUNTER — Encounter: Payer: Self-pay | Admitting: Sports Medicine

## 2019-12-31 DIAGNOSIS — L97411 Non-pressure chronic ulcer of right heel and midfoot limited to breakdown of skin: Secondary | ICD-10-CM | POA: Diagnosis not present

## 2019-12-31 NOTE — Progress Notes (Signed)
Subjective: Chad Burgess is a 79 y.o. male patient seen in office for evaluation of ulceration of the right heel.  Patient is status post discharge from [facility] and is currently on IV antibiotics for probable acute OM at calcaneus. Wife reports that the wound is looking better, not as big, some drainage but not as much as previous.   Patient denies any pain, redness, warmth, swelling or any other symptoms at this time.  Patient Active Problem List   Diagnosis Date Noted  . Open wound of heel, right, initial encounter 11/15/2019  . 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 Diabetes mellitus ([place]) 07/13/2017  . HTN (hypertension) 07/13/2017  . Dyslipidemia 07/13/2017   Current Outpatient Medications on File Prior to Visit  Medication Sig Dispense Refill  . allopurinol (ZYLOPRIM) 300 MG tablet Take 1 tablet by mouth daily.    . cloNIDine (CATAPRES) 0.2 MG tablet TAKE 1 TABLET (0.2mg ) BY MOUTH EVERYDAY AT BEDTIME  4  . colchicine 0.6 MG tablet Take 1 tablet (0.6 mg total) by mouth daily. During gout flare up 90 tablet 0  . collagenase (SANTYL) ointment Apply 1 application topically daily. Right heel wound measures 2 x 3cm. 30 g 5  . digoxin (LANOXIN) 0.25 MG tablet Take 250 mcg by mouth daily.  4  . diltiazem (DILT-XR) 120 MG 24 hr capsule Take 1 capsule by mouth daily.    . ertapenem (INVANZ) IVPB Inject 1 g into the vein. Daily through PICC line    . febuxostat (ULORIC) 40 MG tablet Take 40 mg by mouth daily.    . fenofibrate 160 MG tablet Take 160 mg by mouth daily.  4  . Fluticasone-Umeclidin-Vilant (TRELEGY ELLIPTA) 100-62.5-25 MCG/INH AEPB INHALE 1 PUFF BY MOUTH DAILY    . lisinopril (PRINIVIL,ZESTRIL) 40 MG tablet Take 40 mg by mouth 2 (two) times daily.  4  . metFORMIN (GLUCOPHAGE) 1000 MG tablet Take 1,000 mg by mouth 2 (two) times daily.  1  . methylPREDNISolone (MEDROL DOSEPAK) 4 MG TBPK tablet 6 Day Taper Pack. Take as Directed. 21 tablet 0  . metoprolol tartrate  (LOPRESSOR) 100 MG tablet Take 1 tablet (100mg ) by mouth once daily.  1  . traMADol (ULTRAM) 50 MG tablet Take 1 tablet by mouth every 8 (eight) hours as needed. (Patient not taking: Reported on 12/14/2019)    . triamterene-hydrochlorothiazide (DYAZIDE) 37.5-25 MG capsule Take 1 capsule by mouth daily.   1  . warfarin (COUMADIN) 10 MG tablet Take 10mg  by mouth four times weekly or as directed.  2   No current facility-administered medications on file prior to visit.   No Known Allergies  No results found for this or any previous visit (from the past 2160 hour(s)).  Objective: There were no vitals filed for this visit.  General: Patient is awake, alert, oriented x 3 and in no acute distress.  Dermatology: Skin is warm and dry bilateral with a full thickness ulceration present  Right central heel. Ulceration measures 3 cm x 4 cm with sinus tract that tunnels 2 cm at the 2:00 margin of the wound. There is a mildly keratotic border with a granular base. The ulceration probe close to bone. There is no malodor, clear active drainage, no erythema, no edema. No acute signs of infection.   Vascular: Dorsalis Pedis pulse = 1/4 Bilateral,  Posterior Tibial pulse = 1/4 Bilateral,  Capillary Fill Time < 5 seconds  Neurologic: Protective sensation absent bilateral.  Musculosketal: There is no pain to  palpation to ulcerated area at right heel.  No results for input(s): GRAMSTAIN, LABORGA in the last 8760 hours.  Assessment and Plan:  Problem List Items Addressed This Visit      Endocrine   Diabetes mellitus ([place])    Other Visit Diagnoses    Skin ulcer of right heel with necrosis of muscle ([place])    -  Primary   Long term current use of anticoagulant       Long term (current) use of antibiotics       picc line in place      -Examined patient and discussed the progression of the wound and treatment alternatives. -Cleanse ulceration and using a sterile tissue nipper minimally debrided the  margins of the right heel ulcer and then utilizing a Q-tip applied packing to the draining sinus of the wound bed are covered with dry dressing.  Advised patient and wife to continue with doing packing dressing daily with assistance from home nursing.  At this time we will discontinue wound VAC and continue with this packing dressing -Continue with PICC line antibiotics -Continue with heel offloading shoe -Continue with use of walker and wheelchair to minimize pressure to the right foot like before - Advised patient to go to the ER or return to office if the wound worsens or if constitutional symptoms are present. -Patient to return to office in 1 to 2 weeks for follow up care and evaluation or sooner if problems arise.  Landis Martins, DPM

## 2020-01-02 ENCOUNTER — Encounter: Payer: Self-pay | Admitting: Sports Medicine

## 2020-01-02 ENCOUNTER — Telehealth: Payer: Self-pay

## 2020-01-02 DIAGNOSIS — E785 Hyperlipidemia, unspecified: Secondary | ICD-10-CM | POA: Diagnosis not present

## 2020-01-02 DIAGNOSIS — I10 Essential (primary) hypertension: Secondary | ICD-10-CM | POA: Diagnosis not present

## 2020-01-02 DIAGNOSIS — Z7982 Long term (current) use of aspirin: Secondary | ICD-10-CM | POA: Diagnosis not present

## 2020-01-02 DIAGNOSIS — Z8673 Personal history of transient ischemic attack (TIA), and cerebral infarction without residual deficits: Secondary | ICD-10-CM | POA: Diagnosis not present

## 2020-01-02 DIAGNOSIS — Z792 Long term (current) use of antibiotics: Secondary | ICD-10-CM | POA: Diagnosis not present

## 2020-01-02 DIAGNOSIS — E1169 Type 2 diabetes mellitus with other specified complication: Secondary | ICD-10-CM | POA: Diagnosis not present

## 2020-01-02 DIAGNOSIS — L97416 Non-pressure chronic ulcer of right heel and midfoot with bone involvement without evidence of necrosis: Secondary | ICD-10-CM | POA: Diagnosis not present

## 2020-01-02 DIAGNOSIS — E1151 Type 2 diabetes mellitus with diabetic peripheral angiopathy without gangrene: Secondary | ICD-10-CM | POA: Diagnosis not present

## 2020-01-02 DIAGNOSIS — Z452 Encounter for adjustment and management of vascular access device: Secondary | ICD-10-CM | POA: Diagnosis not present

## 2020-01-02 DIAGNOSIS — J9811 Atelectasis: Secondary | ICD-10-CM | POA: Diagnosis not present

## 2020-01-02 DIAGNOSIS — M109 Gout, unspecified: Secondary | ICD-10-CM | POA: Diagnosis not present

## 2020-01-02 DIAGNOSIS — Z7901 Long term (current) use of anticoagulants: Secondary | ICD-10-CM | POA: Diagnosis not present

## 2020-01-02 DIAGNOSIS — M86171 Other acute osteomyelitis, right ankle and foot: Secondary | ICD-10-CM | POA: Diagnosis not present

## 2020-01-02 DIAGNOSIS — E11621 Type 2 diabetes mellitus with foot ulcer: Secondary | ICD-10-CM | POA: Diagnosis not present

## 2020-01-02 DIAGNOSIS — I482 Chronic atrial fibrillation, unspecified: Secondary | ICD-10-CM | POA: Diagnosis not present

## 2020-01-02 NOTE — Telephone Encounter (Signed)
-----   Message from Landis Martins, [place] sent at 12/31/2019  9:17 AM EDT ----- Regarding: Wound care orders Please let patient home nurse know Christella from [facility] that we have discontinued the wound vac, they can send it back to [facility]. Continue with packing to the wound or if they can get acticoat rope dressing that wound be good to use 2x per week when they come out. The patient's wife and daughter can continue to help with the dressing changes too. Continue with picc line antibiotics Thanks Dr. Cannon Kettle

## 2020-01-02 NOTE — Telephone Encounter (Signed)
Called Christella form [facility] and gave new wound care orders given from Dr. Cannon Kettle on 12/31/19 at 9:17AM.

## 2020-01-03 DIAGNOSIS — Z452 Encounter for adjustment and management of vascular access device: Secondary | ICD-10-CM | POA: Diagnosis not present

## 2020-01-03 DIAGNOSIS — I482 Chronic atrial fibrillation, unspecified: Secondary | ICD-10-CM | POA: Diagnosis not present

## 2020-01-03 DIAGNOSIS — Z7982 Long term (current) use of aspirin: Secondary | ICD-10-CM | POA: Diagnosis not present

## 2020-01-03 DIAGNOSIS — M109 Gout, unspecified: Secondary | ICD-10-CM | POA: Diagnosis not present

## 2020-01-03 DIAGNOSIS — E1151 Type 2 diabetes mellitus with diabetic peripheral angiopathy without gangrene: Secondary | ICD-10-CM | POA: Diagnosis not present

## 2020-01-03 DIAGNOSIS — I10 Essential (primary) hypertension: Secondary | ICD-10-CM | POA: Diagnosis not present

## 2020-01-03 DIAGNOSIS — J9811 Atelectasis: Secondary | ICD-10-CM | POA: Diagnosis not present

## 2020-01-03 DIAGNOSIS — M86171 Other acute osteomyelitis, right ankle and foot: Secondary | ICD-10-CM | POA: Diagnosis not present

## 2020-01-03 DIAGNOSIS — Z7901 Long term (current) use of anticoagulants: Secondary | ICD-10-CM | POA: Diagnosis not present

## 2020-01-03 DIAGNOSIS — E11621 Type 2 diabetes mellitus with foot ulcer: Secondary | ICD-10-CM | POA: Diagnosis not present

## 2020-01-03 DIAGNOSIS — E785 Hyperlipidemia, unspecified: Secondary | ICD-10-CM | POA: Diagnosis not present

## 2020-01-03 DIAGNOSIS — Z8673 Personal history of transient ischemic attack (TIA), and cerebral infarction without residual deficits: Secondary | ICD-10-CM | POA: Diagnosis not present

## 2020-01-03 DIAGNOSIS — Z792 Long term (current) use of antibiotics: Secondary | ICD-10-CM | POA: Diagnosis not present

## 2020-01-03 DIAGNOSIS — L97416 Non-pressure chronic ulcer of right heel and midfoot with bone involvement without evidence of necrosis: Secondary | ICD-10-CM | POA: Diagnosis not present

## 2020-01-03 DIAGNOSIS — E1169 Type 2 diabetes mellitus with other specified complication: Secondary | ICD-10-CM | POA: Diagnosis not present

## 2020-01-04 DIAGNOSIS — I152 Hypertension secondary to endocrine disorders: Secondary | ICD-10-CM | POA: Diagnosis not present

## 2020-01-04 DIAGNOSIS — E1159 Type 2 diabetes mellitus with other circulatory complications: Secondary | ICD-10-CM | POA: Diagnosis not present

## 2020-01-05 ENCOUNTER — Telehealth: Payer: Self-pay

## 2020-01-05 DIAGNOSIS — L97411 Non-pressure chronic ulcer of right heel and midfoot limited to breakdown of skin: Secondary | ICD-10-CM | POA: Diagnosis not present

## 2020-01-05 NOTE — Telephone Encounter (Signed)
Spoke to Jason-PT and gave new PT orders given from Dr. Cannon Kettle on 01/04/20 at 9:40

## 2020-01-05 NOTE — Telephone Encounter (Signed)
-----   Message from Landis Martins, [place] sent at 01/04/2020  9:40 PM EDT ----- Regarding: [facility] PT Orders Please call [facility] and let them know that patient can continue with home PT and can weightbear with offloading shoe to keep pressure off the heel wound. I do not want patient to attempt to walk or weightbear without the therapist since he is weak and is unsteady and is high risk for falls. He may do seated exercises and stretches at home after the therapist leaves but can not walk by himself. Thanks Dr. Chauncey Cruel

## 2020-01-06 DIAGNOSIS — J9811 Atelectasis: Secondary | ICD-10-CM | POA: Diagnosis not present

## 2020-01-06 DIAGNOSIS — E785 Hyperlipidemia, unspecified: Secondary | ICD-10-CM | POA: Diagnosis not present

## 2020-01-06 DIAGNOSIS — Z8673 Personal history of transient ischemic attack (TIA), and cerebral infarction without residual deficits: Secondary | ICD-10-CM | POA: Diagnosis not present

## 2020-01-06 DIAGNOSIS — M86171 Other acute osteomyelitis, right ankle and foot: Secondary | ICD-10-CM | POA: Diagnosis not present

## 2020-01-06 DIAGNOSIS — E11621 Type 2 diabetes mellitus with foot ulcer: Secondary | ICD-10-CM | POA: Diagnosis not present

## 2020-01-06 DIAGNOSIS — Z7982 Long term (current) use of aspirin: Secondary | ICD-10-CM | POA: Diagnosis not present

## 2020-01-06 DIAGNOSIS — E1169 Type 2 diabetes mellitus with other specified complication: Secondary | ICD-10-CM | POA: Diagnosis not present

## 2020-01-06 DIAGNOSIS — M109 Gout, unspecified: Secondary | ICD-10-CM | POA: Diagnosis not present

## 2020-01-06 DIAGNOSIS — L97416 Non-pressure chronic ulcer of right heel and midfoot with bone involvement without evidence of necrosis: Secondary | ICD-10-CM | POA: Diagnosis not present

## 2020-01-06 DIAGNOSIS — E1151 Type 2 diabetes mellitus with diabetic peripheral angiopathy without gangrene: Secondary | ICD-10-CM | POA: Diagnosis not present

## 2020-01-06 DIAGNOSIS — I482 Chronic atrial fibrillation, unspecified: Secondary | ICD-10-CM | POA: Diagnosis not present

## 2020-01-06 DIAGNOSIS — I10 Essential (primary) hypertension: Secondary | ICD-10-CM | POA: Diagnosis not present

## 2020-01-06 DIAGNOSIS — Z792 Long term (current) use of antibiotics: Secondary | ICD-10-CM | POA: Diagnosis not present

## 2020-01-06 DIAGNOSIS — Z452 Encounter for adjustment and management of vascular access device: Secondary | ICD-10-CM | POA: Diagnosis not present

## 2020-01-06 DIAGNOSIS — Z7901 Long term (current) use of anticoagulants: Secondary | ICD-10-CM | POA: Diagnosis not present

## 2020-01-08 DIAGNOSIS — I152 Hypertension secondary to endocrine disorders: Secondary | ICD-10-CM | POA: Diagnosis not present

## 2020-01-08 DIAGNOSIS — E11621 Type 2 diabetes mellitus with foot ulcer: Secondary | ICD-10-CM | POA: Diagnosis not present

## 2020-01-08 DIAGNOSIS — I4891 Unspecified atrial fibrillation: Secondary | ICD-10-CM | POA: Diagnosis not present

## 2020-01-08 DIAGNOSIS — E1159 Type 2 diabetes mellitus with other circulatory complications: Secondary | ICD-10-CM | POA: Diagnosis not present

## 2020-01-09 ENCOUNTER — Telehealth: Payer: Self-pay

## 2020-01-09 ENCOUNTER — Encounter: Payer: Self-pay | Admitting: Sports Medicine

## 2020-01-09 DIAGNOSIS — Z452 Encounter for adjustment and management of vascular access device: Secondary | ICD-10-CM | POA: Diagnosis not present

## 2020-01-09 DIAGNOSIS — E1169 Type 2 diabetes mellitus with other specified complication: Secondary | ICD-10-CM | POA: Diagnosis not present

## 2020-01-09 DIAGNOSIS — E1151 Type 2 diabetes mellitus with diabetic peripheral angiopathy without gangrene: Secondary | ICD-10-CM | POA: Diagnosis not present

## 2020-01-09 DIAGNOSIS — M86171 Other acute osteomyelitis, right ankle and foot: Secondary | ICD-10-CM | POA: Diagnosis not present

## 2020-01-09 DIAGNOSIS — M109 Gout, unspecified: Secondary | ICD-10-CM | POA: Diagnosis not present

## 2020-01-09 DIAGNOSIS — Z7982 Long term (current) use of aspirin: Secondary | ICD-10-CM | POA: Diagnosis not present

## 2020-01-09 DIAGNOSIS — Z7901 Long term (current) use of anticoagulants: Secondary | ICD-10-CM | POA: Diagnosis not present

## 2020-01-09 DIAGNOSIS — Z792 Long term (current) use of antibiotics: Secondary | ICD-10-CM | POA: Diagnosis not present

## 2020-01-09 DIAGNOSIS — J9811 Atelectasis: Secondary | ICD-10-CM | POA: Diagnosis not present

## 2020-01-09 DIAGNOSIS — E785 Hyperlipidemia, unspecified: Secondary | ICD-10-CM | POA: Diagnosis not present

## 2020-01-09 DIAGNOSIS — L97416 Non-pressure chronic ulcer of right heel and midfoot with bone involvement without evidence of necrosis: Secondary | ICD-10-CM | POA: Diagnosis not present

## 2020-01-09 DIAGNOSIS — Z8673 Personal history of transient ischemic attack (TIA), and cerebral infarction without residual deficits: Secondary | ICD-10-CM | POA: Diagnosis not present

## 2020-01-09 DIAGNOSIS — E11621 Type 2 diabetes mellitus with foot ulcer: Secondary | ICD-10-CM | POA: Diagnosis not present

## 2020-01-09 DIAGNOSIS — I482 Chronic atrial fibrillation, unspecified: Secondary | ICD-10-CM | POA: Diagnosis not present

## 2020-01-09 DIAGNOSIS — I10 Essential (primary) hypertension: Secondary | ICD-10-CM | POA: Diagnosis not present

## 2020-01-09 NOTE — Telephone Encounter (Signed)
Ok great. Glad the wound is looking better. I will re-order any antibiotics if we need them but likely we will not need more. I will check him on 8/6 and decide if the picc line is ready to be removed and we will send home nursing orders after his visit on 8/6 -Dr.S

## 2020-01-09 NOTE — Telephone Encounter (Signed)
Contacted Chad Burgess back and notified her that per Dr. Cannon Kettle pt is to continue picc line until we see him on 8/6. Chad Burgess stated we will have to notify or send a new Rx to [facility]aramcy since last day of abx runs out on the [place] also mentioned wound is 1cm x 2cm, 2 1/2 deep but does not have the cavity it did before

## 2020-01-09 NOTE — Telephone Encounter (Signed)
Continue PICC line antibiotics. He will see me on 8/6 and after that visit if the foot looks good then we will send orders for them to pull the picc line. Thanks Dr Chauncey Cruel

## 2020-01-09 NOTE — Telephone Encounter (Signed)
Chad Burgess from [facility] called and LVM at the nurse line requesting new picc line orders. Chad Burgess stated last abx woule be Aug. 6th and would like to know about order to pull out picc line.,

## 2020-01-10 DIAGNOSIS — L97411 Non-pressure chronic ulcer of right heel and midfoot limited to breakdown of skin: Secondary | ICD-10-CM | POA: Diagnosis not present

## 2020-01-10 NOTE — Telephone Encounter (Signed)
Spoke to [employer] yesterday and advised her that we will send any new wound orders to her if anything changes

## 2020-01-11 DIAGNOSIS — E1151 Type 2 diabetes mellitus with diabetic peripheral angiopathy without gangrene: Secondary | ICD-10-CM | POA: Diagnosis not present

## 2020-01-11 DIAGNOSIS — M109 Gout, unspecified: Secondary | ICD-10-CM | POA: Diagnosis not present

## 2020-01-11 DIAGNOSIS — J9811 Atelectasis: Secondary | ICD-10-CM | POA: Diagnosis not present

## 2020-01-11 DIAGNOSIS — Z7982 Long term (current) use of aspirin: Secondary | ICD-10-CM | POA: Diagnosis not present

## 2020-01-11 DIAGNOSIS — Z8673 Personal history of transient ischemic attack (TIA), and cerebral infarction without residual deficits: Secondary | ICD-10-CM | POA: Diagnosis not present

## 2020-01-11 DIAGNOSIS — E785 Hyperlipidemia, unspecified: Secondary | ICD-10-CM | POA: Diagnosis not present

## 2020-01-11 DIAGNOSIS — E1159 Type 2 diabetes mellitus with other circulatory complications: Secondary | ICD-10-CM | POA: Diagnosis not present

## 2020-01-11 DIAGNOSIS — Z792 Long term (current) use of antibiotics: Secondary | ICD-10-CM | POA: Diagnosis not present

## 2020-01-11 DIAGNOSIS — M86171 Other acute osteomyelitis, right ankle and foot: Secondary | ICD-10-CM | POA: Diagnosis not present

## 2020-01-11 DIAGNOSIS — E1169 Type 2 diabetes mellitus with other specified complication: Secondary | ICD-10-CM | POA: Diagnosis not present

## 2020-01-11 DIAGNOSIS — Z7901 Long term (current) use of anticoagulants: Secondary | ICD-10-CM | POA: Diagnosis not present

## 2020-01-11 DIAGNOSIS — I152 Hypertension secondary to endocrine disorders: Secondary | ICD-10-CM | POA: Diagnosis not present

## 2020-01-11 DIAGNOSIS — I10 Essential (primary) hypertension: Secondary | ICD-10-CM | POA: Diagnosis not present

## 2020-01-11 DIAGNOSIS — E11621 Type 2 diabetes mellitus with foot ulcer: Secondary | ICD-10-CM | POA: Diagnosis not present

## 2020-01-11 DIAGNOSIS — L97416 Non-pressure chronic ulcer of right heel and midfoot with bone involvement without evidence of necrosis: Secondary | ICD-10-CM | POA: Diagnosis not present

## 2020-01-11 DIAGNOSIS — I482 Chronic atrial fibrillation, unspecified: Secondary | ICD-10-CM | POA: Diagnosis not present

## 2020-01-11 DIAGNOSIS — Z452 Encounter for adjustment and management of vascular access device: Secondary | ICD-10-CM | POA: Diagnosis not present

## 2020-01-13 ENCOUNTER — Ambulatory Visit: Payer: Medicare Other | Admitting: Sports Medicine

## 2020-01-13 ENCOUNTER — Encounter: Payer: Self-pay | Admitting: Sports Medicine

## 2020-01-13 ENCOUNTER — Telehealth: Payer: Self-pay

## 2020-01-13 ENCOUNTER — Other Ambulatory Visit: Payer: Self-pay

## 2020-01-13 DIAGNOSIS — E1142 Type 2 diabetes mellitus with diabetic polyneuropathy: Secondary | ICD-10-CM

## 2020-01-13 DIAGNOSIS — L97412 Non-pressure chronic ulcer of right heel and midfoot with fat layer exposed: Secondary | ICD-10-CM

## 2020-01-13 DIAGNOSIS — Z7901 Long term (current) use of anticoagulants: Secondary | ICD-10-CM

## 2020-01-13 NOTE — Telephone Encounter (Signed)
Patient was seen today and they may remove the picc line on Monday. Continue with PT to try to strengthen his legs as well, patient is very weak. -Dr. Chauncey Cruel

## 2020-01-13 NOTE — Telephone Encounter (Signed)
Chad Burgess from [facility] called and LVM at the nurse line stating or requesting to know if she should continue with IV picc line and labs on the pt or D/C, please advice

## 2020-01-13 NOTE — Progress Notes (Signed)
Subjective: Chad Burgess is a 79 y.o. male patient seen in office for evaluation of ulceration of the right heel.  Patient is status post discharge from [facility] and is currently on IV antibiotics for probable acute OM at calcaneus. Wife reports that the wound is looking better unable to get much packing in the area with less drainage.  Wife reports that he has finished his PICC line antibiotic course today.   Patient denies any pain, redness, warmth, swelling or any other symptoms at this time.   Patient Active Problem List   Diagnosis Date Noted  . Open wound of heel, right, initial encounter 11/15/2019  . 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 Diabetes mellitus ([place]) 07/13/2017  . HTN (hypertension) 07/13/2017  . Dyslipidemia 07/13/2017   Current Outpatient Medications on File Prior to Visit  Medication Sig Dispense Refill  . allopurinol (ZYLOPRIM) 300 MG tablet Take 1 tablet by mouth daily.    . cloNIDine (CATAPRES) 0.2 MG tablet TAKE 1 TABLET (0.2mg ) BY MOUTH EVERYDAY AT BEDTIME  4  . colchicine 0.6 MG tablet Take 1 tablet (0.6 mg total) by mouth daily. During gout flare up 90 tablet 0  . collagenase (SANTYL) ointment Apply 1 application topically daily. Right heel wound measures 2 x 3cm. 30 g 5  . digoxin (LANOXIN) 0.25 MG tablet Take 250 mcg by mouth daily.  4  . diltiazem (DILT-XR) 120 MG 24 hr capsule Take 1 capsule by mouth daily.    . ertapenem (INVANZ) IVPB Inject 1 g into the vein. Daily through PICC line    . febuxostat (ULORIC) 40 MG tablet Take 40 mg by mouth daily.    . fenofibrate 160 MG tablet Take 160 mg by mouth daily.  4  . Fluticasone-Umeclidin-Vilant (TRELEGY ELLIPTA) 100-62.5-25 MCG/INH AEPB INHALE 1 PUFF BY MOUTH DAILY    . lisinopril (PRINIVIL,ZESTRIL) 40 MG tablet Take 40 mg by mouth 2 (two) times daily.  4  . metFORMIN (GLUCOPHAGE) 1000 MG tablet Take 1,000 mg by mouth 2 (two) times daily.  1  . methylPREDNISolone (MEDROL DOSEPAK) 4 MG TBPK  tablet 6 Day Taper Pack. Take as Directed. 21 tablet 0  . metoprolol tartrate (LOPRESSOR) 100 MG tablet Take 1 tablet (100mg ) by mouth once daily.  1  . traMADol (ULTRAM) 50 MG tablet Take 1 tablet by mouth every 8 (eight) hours as needed. (Patient not taking: Reported on 12/14/2019)    . triamterene-hydrochlorothiazide (DYAZIDE) 37.5-25 MG capsule Take 1 capsule by mouth daily.   1  . warfarin (COUMADIN) 10 MG tablet Take 10mg  by mouth four times weekly or as directed.  2   No current facility-administered medications on file prior to visit.   No Known Allergies  No results found for this or any previous visit (from the past 2160 hour(s)).  Objective: There were no vitals filed for this visit.  General: Patient is awake, alert, oriented x 3 and in no acute distress.  Dermatology: Skin is warm and dry bilateral with a full thickness ulceration present  Right central heel. Ulceration measures 2 cm x 3 cm with no sinus tract. There is a mildly keratotic border with a granular base. The ulceration probe close to bone. There is no malodor, clear active drainage, no erythema, no edema. No acute signs of infection.     Vascular: Dorsalis Pedis pulse = 1/4 Bilateral,  Posterior Tibial pulse = 1/4 Bilateral,  Capillary Fill Time < 5 seconds  Neurologic: Protective sensation absent bilateral.  Musculosketal: There is no pain to palpation to ulcerated area at right heel.  Subjective weakness and difficulty and some pain feeling like his legs are going to give out with standing.  No results for input(s): GRAMSTAIN, LABORGA in the last 8760 hours.  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 -Cleanse ulceration and using a sterile tissue nipper minimally debrided the margins of the right heel ulcer and then utilizing a Q-tip applied packing the wound bed are covered with dry dressing.  Advised patient and wife to continue with doing packing dressing daily with assistance from home nursing. -Continue with PICC line antibiotics until completed on today and then nurse on Monday may remove PICC line meanwhile over the weekend continue to flush PICC line to prevent against clotting -Continue with heel offloading shoe -Continue with PT to help with strengthening the legs and advised wife.  He still seems pretty weak may benefit from a referral to orthopedics - Advised patient to go to the ER or return to office if the wound worsens or if constitutional symptoms are present. -Patient to return to office in 2-3weeks for follow up care and evaluation or sooner if problems arise.  Landis Martins, DPM

## 2020-01-16 DIAGNOSIS — Z7982 Long term (current) use of aspirin: Secondary | ICD-10-CM | POA: Diagnosis not present

## 2020-01-16 DIAGNOSIS — E1151 Type 2 diabetes mellitus with diabetic peripheral angiopathy without gangrene: Secondary | ICD-10-CM | POA: Diagnosis not present

## 2020-01-16 DIAGNOSIS — Z8673 Personal history of transient ischemic attack (TIA), and cerebral infarction without residual deficits: Secondary | ICD-10-CM | POA: Diagnosis not present

## 2020-01-16 DIAGNOSIS — L97416 Non-pressure chronic ulcer of right heel and midfoot with bone involvement without evidence of necrosis: Secondary | ICD-10-CM | POA: Diagnosis not present

## 2020-01-16 DIAGNOSIS — E11621 Type 2 diabetes mellitus with foot ulcer: Secondary | ICD-10-CM | POA: Diagnosis not present

## 2020-01-16 DIAGNOSIS — M109 Gout, unspecified: Secondary | ICD-10-CM | POA: Diagnosis not present

## 2020-01-16 DIAGNOSIS — I10 Essential (primary) hypertension: Secondary | ICD-10-CM | POA: Diagnosis not present

## 2020-01-16 DIAGNOSIS — J9811 Atelectasis: Secondary | ICD-10-CM | POA: Diagnosis not present

## 2020-01-16 DIAGNOSIS — Z7901 Long term (current) use of anticoagulants: Secondary | ICD-10-CM | POA: Diagnosis not present

## 2020-01-16 DIAGNOSIS — E785 Hyperlipidemia, unspecified: Secondary | ICD-10-CM | POA: Diagnosis not present

## 2020-01-16 DIAGNOSIS — Z792 Long term (current) use of antibiotics: Secondary | ICD-10-CM | POA: Diagnosis not present

## 2020-01-16 DIAGNOSIS — M86171 Other acute osteomyelitis, right ankle and foot: Secondary | ICD-10-CM | POA: Diagnosis not present

## 2020-01-16 DIAGNOSIS — Z452 Encounter for adjustment and management of vascular access device: Secondary | ICD-10-CM | POA: Diagnosis not present

## 2020-01-16 DIAGNOSIS — E1169 Type 2 diabetes mellitus with other specified complication: Secondary | ICD-10-CM | POA: Diagnosis not present

## 2020-01-16 DIAGNOSIS — I482 Chronic atrial fibrillation, unspecified: Secondary | ICD-10-CM | POA: Diagnosis not present

## 2020-01-16 NOTE — Telephone Encounter (Signed)
Contacted Cristella from [facility] and gave her orders from Dr. Cannon Kettle to remove Picc line and continue PT

## 2020-01-18 DIAGNOSIS — E785 Hyperlipidemia, unspecified: Secondary | ICD-10-CM | POA: Diagnosis not present

## 2020-01-18 DIAGNOSIS — M86171 Other acute osteomyelitis, right ankle and foot: Secondary | ICD-10-CM | POA: Diagnosis not present

## 2020-01-18 DIAGNOSIS — E1151 Type 2 diabetes mellitus with diabetic peripheral angiopathy without gangrene: Secondary | ICD-10-CM | POA: Diagnosis not present

## 2020-01-18 DIAGNOSIS — E1169 Type 2 diabetes mellitus with other specified complication: Secondary | ICD-10-CM | POA: Diagnosis not present

## 2020-01-18 DIAGNOSIS — Z452 Encounter for adjustment and management of vascular access device: Secondary | ICD-10-CM | POA: Diagnosis not present

## 2020-01-18 DIAGNOSIS — I10 Essential (primary) hypertension: Secondary | ICD-10-CM | POA: Diagnosis not present

## 2020-01-18 DIAGNOSIS — M109 Gout, unspecified: Secondary | ICD-10-CM | POA: Diagnosis not present

## 2020-01-18 DIAGNOSIS — L97416 Non-pressure chronic ulcer of right heel and midfoot with bone involvement without evidence of necrosis: Secondary | ICD-10-CM | POA: Diagnosis not present

## 2020-01-18 DIAGNOSIS — I482 Chronic atrial fibrillation, unspecified: Secondary | ICD-10-CM | POA: Diagnosis not present

## 2020-01-18 DIAGNOSIS — J9811 Atelectasis: Secondary | ICD-10-CM | POA: Diagnosis not present

## 2020-01-18 DIAGNOSIS — Z7901 Long term (current) use of anticoagulants: Secondary | ICD-10-CM | POA: Diagnosis not present

## 2020-01-18 DIAGNOSIS — E11621 Type 2 diabetes mellitus with foot ulcer: Secondary | ICD-10-CM | POA: Diagnosis not present

## 2020-01-18 DIAGNOSIS — Z792 Long term (current) use of antibiotics: Secondary | ICD-10-CM | POA: Diagnosis not present

## 2020-01-18 DIAGNOSIS — Z7982 Long term (current) use of aspirin: Secondary | ICD-10-CM | POA: Diagnosis not present

## 2020-01-18 DIAGNOSIS — Z8673 Personal history of transient ischemic attack (TIA), and cerebral infarction without residual deficits: Secondary | ICD-10-CM | POA: Diagnosis not present

## 2020-01-20 DIAGNOSIS — M86171 Other acute osteomyelitis, right ankle and foot: Secondary | ICD-10-CM | POA: Diagnosis not present

## 2020-01-20 DIAGNOSIS — Z452 Encounter for adjustment and management of vascular access device: Secondary | ICD-10-CM | POA: Diagnosis not present

## 2020-01-20 DIAGNOSIS — I482 Chronic atrial fibrillation, unspecified: Secondary | ICD-10-CM | POA: Diagnosis not present

## 2020-01-20 DIAGNOSIS — Z8673 Personal history of transient ischemic attack (TIA), and cerebral infarction without residual deficits: Secondary | ICD-10-CM | POA: Diagnosis not present

## 2020-01-20 DIAGNOSIS — Z7982 Long term (current) use of aspirin: Secondary | ICD-10-CM | POA: Diagnosis not present

## 2020-01-20 DIAGNOSIS — J9811 Atelectasis: Secondary | ICD-10-CM | POA: Diagnosis not present

## 2020-01-20 DIAGNOSIS — M109 Gout, unspecified: Secondary | ICD-10-CM | POA: Diagnosis not present

## 2020-01-20 DIAGNOSIS — E1151 Type 2 diabetes mellitus with diabetic peripheral angiopathy without gangrene: Secondary | ICD-10-CM | POA: Diagnosis not present

## 2020-01-20 DIAGNOSIS — E11621 Type 2 diabetes mellitus with foot ulcer: Secondary | ICD-10-CM | POA: Diagnosis not present

## 2020-01-20 DIAGNOSIS — E1169 Type 2 diabetes mellitus with other specified complication: Secondary | ICD-10-CM | POA: Diagnosis not present

## 2020-01-20 DIAGNOSIS — E785 Hyperlipidemia, unspecified: Secondary | ICD-10-CM | POA: Diagnosis not present

## 2020-01-20 DIAGNOSIS — Z792 Long term (current) use of antibiotics: Secondary | ICD-10-CM | POA: Diagnosis not present

## 2020-01-20 DIAGNOSIS — L97416 Non-pressure chronic ulcer of right heel and midfoot with bone involvement without evidence of necrosis: Secondary | ICD-10-CM | POA: Diagnosis not present

## 2020-01-20 DIAGNOSIS — I10 Essential (primary) hypertension: Secondary | ICD-10-CM | POA: Diagnosis not present

## 2020-01-20 DIAGNOSIS — Z7901 Long term (current) use of anticoagulants: Secondary | ICD-10-CM | POA: Diagnosis not present

## 2020-01-23 DIAGNOSIS — E1151 Type 2 diabetes mellitus with diabetic peripheral angiopathy without gangrene: Secondary | ICD-10-CM | POA: Diagnosis not present

## 2020-01-23 DIAGNOSIS — I10 Essential (primary) hypertension: Secondary | ICD-10-CM | POA: Diagnosis not present

## 2020-01-23 DIAGNOSIS — E11621 Type 2 diabetes mellitus with foot ulcer: Secondary | ICD-10-CM | POA: Diagnosis not present

## 2020-01-23 DIAGNOSIS — Z452 Encounter for adjustment and management of vascular access device: Secondary | ICD-10-CM | POA: Diagnosis not present

## 2020-01-23 DIAGNOSIS — Z7901 Long term (current) use of anticoagulants: Secondary | ICD-10-CM | POA: Diagnosis not present

## 2020-01-23 DIAGNOSIS — E785 Hyperlipidemia, unspecified: Secondary | ICD-10-CM | POA: Diagnosis not present

## 2020-01-23 DIAGNOSIS — E1169 Type 2 diabetes mellitus with other specified complication: Secondary | ICD-10-CM | POA: Diagnosis not present

## 2020-01-23 DIAGNOSIS — Z7982 Long term (current) use of aspirin: Secondary | ICD-10-CM | POA: Diagnosis not present

## 2020-01-23 DIAGNOSIS — L97416 Non-pressure chronic ulcer of right heel and midfoot with bone involvement without evidence of necrosis: Secondary | ICD-10-CM | POA: Diagnosis not present

## 2020-01-23 DIAGNOSIS — Z792 Long term (current) use of antibiotics: Secondary | ICD-10-CM | POA: Diagnosis not present

## 2020-01-23 DIAGNOSIS — M86171 Other acute osteomyelitis, right ankle and foot: Secondary | ICD-10-CM | POA: Diagnosis not present

## 2020-01-23 DIAGNOSIS — J9811 Atelectasis: Secondary | ICD-10-CM | POA: Diagnosis not present

## 2020-01-23 DIAGNOSIS — M109 Gout, unspecified: Secondary | ICD-10-CM | POA: Diagnosis not present

## 2020-01-23 DIAGNOSIS — Z8673 Personal history of transient ischemic attack (TIA), and cerebral infarction without residual deficits: Secondary | ICD-10-CM | POA: Diagnosis not present

## 2020-01-23 DIAGNOSIS — I482 Chronic atrial fibrillation, unspecified: Secondary | ICD-10-CM | POA: Diagnosis not present

## 2020-01-27 ENCOUNTER — Telehealth: Payer: Self-pay

## 2020-01-27 DIAGNOSIS — J9811 Atelectasis: Secondary | ICD-10-CM | POA: Diagnosis not present

## 2020-01-27 DIAGNOSIS — M86171 Other acute osteomyelitis, right ankle and foot: Secondary | ICD-10-CM | POA: Diagnosis not present

## 2020-01-27 DIAGNOSIS — Z7982 Long term (current) use of aspirin: Secondary | ICD-10-CM | POA: Diagnosis not present

## 2020-01-27 DIAGNOSIS — M109 Gout, unspecified: Secondary | ICD-10-CM | POA: Diagnosis not present

## 2020-01-27 DIAGNOSIS — Z7901 Long term (current) use of anticoagulants: Secondary | ICD-10-CM | POA: Diagnosis not present

## 2020-01-27 DIAGNOSIS — E11621 Type 2 diabetes mellitus with foot ulcer: Secondary | ICD-10-CM | POA: Diagnosis not present

## 2020-01-27 DIAGNOSIS — I482 Chronic atrial fibrillation, unspecified: Secondary | ICD-10-CM | POA: Diagnosis not present

## 2020-01-27 DIAGNOSIS — I10 Essential (primary) hypertension: Secondary | ICD-10-CM | POA: Diagnosis not present

## 2020-01-27 DIAGNOSIS — Z452 Encounter for adjustment and management of vascular access device: Secondary | ICD-10-CM | POA: Diagnosis not present

## 2020-01-27 DIAGNOSIS — Z792 Long term (current) use of antibiotics: Secondary | ICD-10-CM | POA: Diagnosis not present

## 2020-01-27 DIAGNOSIS — Z8673 Personal history of transient ischemic attack (TIA), and cerebral infarction without residual deficits: Secondary | ICD-10-CM | POA: Diagnosis not present

## 2020-01-27 DIAGNOSIS — E785 Hyperlipidemia, unspecified: Secondary | ICD-10-CM | POA: Diagnosis not present

## 2020-01-27 DIAGNOSIS — E1169 Type 2 diabetes mellitus with other specified complication: Secondary | ICD-10-CM | POA: Diagnosis not present

## 2020-01-27 DIAGNOSIS — E1151 Type 2 diabetes mellitus with diabetic peripheral angiopathy without gangrene: Secondary | ICD-10-CM | POA: Diagnosis not present

## 2020-01-27 DIAGNOSIS — L97416 Non-pressure chronic ulcer of right heel and midfoot with bone involvement without evidence of necrosis: Secondary | ICD-10-CM | POA: Diagnosis not present

## 2020-01-27 NOTE — Telephone Encounter (Signed)
Yes they can come weekly. Weightbearing with PT only. I do not want him attempting to weight bear on his own for safety and fall risk reasons. Thanks Dr. Chauncey Cruel

## 2020-01-27 NOTE — Telephone Encounter (Signed)
Chad Burgess from [facility] called requesting verbal orders to see pt once next week for insurance pre-certification.  [name] would also like to know status/ orders as if the pt should be weightbearing or  NWB? Please advise

## 2020-01-30 DIAGNOSIS — I482 Chronic atrial fibrillation, unspecified: Secondary | ICD-10-CM | POA: Diagnosis not present

## 2020-01-30 DIAGNOSIS — Z7901 Long term (current) use of anticoagulants: Secondary | ICD-10-CM | POA: Diagnosis not present

## 2020-01-30 DIAGNOSIS — I152 Hypertension secondary to endocrine disorders: Secondary | ICD-10-CM | POA: Diagnosis not present

## 2020-01-30 DIAGNOSIS — E1159 Type 2 diabetes mellitus with other circulatory complications: Secondary | ICD-10-CM | POA: Diagnosis not present

## 2020-01-30 NOTE — Telephone Encounter (Signed)
Contacted [facility] PT and advised him that per Dr. Cannon Kettle they can do PT with pt. once weekly, weightbearing with PT only and do not attempt to weight bear on his own due to fall risks. [name] stated understanding

## 2020-01-31 DIAGNOSIS — Z452 Encounter for adjustment and management of vascular access device: Secondary | ICD-10-CM | POA: Diagnosis not present

## 2020-01-31 DIAGNOSIS — M109 Gout, unspecified: Secondary | ICD-10-CM | POA: Diagnosis not present

## 2020-01-31 DIAGNOSIS — J9811 Atelectasis: Secondary | ICD-10-CM | POA: Diagnosis not present

## 2020-01-31 DIAGNOSIS — Z7901 Long term (current) use of anticoagulants: Secondary | ICD-10-CM | POA: Diagnosis not present

## 2020-01-31 DIAGNOSIS — I10 Essential (primary) hypertension: Secondary | ICD-10-CM | POA: Diagnosis not present

## 2020-01-31 DIAGNOSIS — Z8673 Personal history of transient ischemic attack (TIA), and cerebral infarction without residual deficits: Secondary | ICD-10-CM | POA: Diagnosis not present

## 2020-01-31 DIAGNOSIS — E11621 Type 2 diabetes mellitus with foot ulcer: Secondary | ICD-10-CM | POA: Diagnosis not present

## 2020-01-31 DIAGNOSIS — L97416 Non-pressure chronic ulcer of right heel and midfoot with bone involvement without evidence of necrosis: Secondary | ICD-10-CM | POA: Diagnosis not present

## 2020-01-31 DIAGNOSIS — M86171 Other acute osteomyelitis, right ankle and foot: Secondary | ICD-10-CM | POA: Diagnosis not present

## 2020-01-31 DIAGNOSIS — E1169 Type 2 diabetes mellitus with other specified complication: Secondary | ICD-10-CM | POA: Diagnosis not present

## 2020-01-31 DIAGNOSIS — Z7982 Long term (current) use of aspirin: Secondary | ICD-10-CM | POA: Diagnosis not present

## 2020-01-31 DIAGNOSIS — E785 Hyperlipidemia, unspecified: Secondary | ICD-10-CM | POA: Diagnosis not present

## 2020-01-31 DIAGNOSIS — Z792 Long term (current) use of antibiotics: Secondary | ICD-10-CM | POA: Diagnosis not present

## 2020-01-31 DIAGNOSIS — I482 Chronic atrial fibrillation, unspecified: Secondary | ICD-10-CM | POA: Diagnosis not present

## 2020-01-31 DIAGNOSIS — E1151 Type 2 diabetes mellitus with diabetic peripheral angiopathy without gangrene: Secondary | ICD-10-CM | POA: Diagnosis not present

## 2020-02-01 ENCOUNTER — Other Ambulatory Visit: Payer: Self-pay

## 2020-02-01 ENCOUNTER — Encounter: Payer: Self-pay | Admitting: Sports Medicine

## 2020-02-01 ENCOUNTER — Ambulatory Visit: Payer: Medicare Other | Admitting: Sports Medicine

## 2020-02-01 DIAGNOSIS — L97412 Non-pressure chronic ulcer of right heel and midfoot with fat layer exposed: Secondary | ICD-10-CM

## 2020-02-01 DIAGNOSIS — E1142 Type 2 diabetes mellitus with diabetic polyneuropathy: Secondary | ICD-10-CM

## 2020-02-01 DIAGNOSIS — Z7901 Long term (current) use of anticoagulants: Secondary | ICD-10-CM

## 2020-02-01 NOTE — Progress Notes (Signed)
Subjective: Chad Burgess is a 79 y.o. male patient seen in office for evaluation of ulceration of the right heel.  Wife reports that the wound is looking better unable to get much packing in the area, there is still a small open but with less drainage. Patient denies any pain, redness, warmth, swelling or any other symptoms at this time.   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     cloNIDine (CATAPRES) 0.2 MG tablet TAKE 1 TABLET (0.2mg ) BY MOUTH EVERYDAY AT BEDTIME  4   colchicine 0.6 MG tablet Take 1 tablet (0.6 mg total) by mouth daily. During gout flare up 90 tablet 0   collagenase (SANTYL) ointment Apply 1 application topically daily. Right heel wound measures 2 x 3cm. 30 g 5   digoxin (LANOXIN) 0.25 MG tablet Take 250 mcg by mouth daily.  4   diltiazem (DILT-XR) 120 MG 24 hr capsule Take 1 capsule by mouth daily.     ertapenem (INVANZ) IVPB Inject 1 g into the vein. Daily through PICC line     febuxostat (ULORIC) 40 MG tablet Take 40 mg by mouth daily.     fenofibrate 160 MG tablet Take 160 mg by mouth daily.  4   Fluticasone-Umeclidin-Vilant (TRELEGY ELLIPTA) 100-62.5-25 MCG/INH AEPB INHALE 1 PUFF BY MOUTH DAILY     lisinopril (PRINIVIL,ZESTRIL) 40 MG tablet Take 40 mg by mouth 2 (two) times daily.  4   metFORMIN (GLUCOPHAGE) 1000 MG tablet Take 1,000 mg by mouth 2 (two) times daily.  1   methylPREDNISolone (MEDROL DOSEPAK) 4 MG TBPK tablet 6 Day Taper Pack. Take as Directed. 21 tablet 0   metoprolol tartrate (LOPRESSOR) 100 MG tablet Take 1 tablet (100mg ) by mouth once daily.  1   spironolactone  (ALDACTONE) 25 MG tablet Take 25 mg by mouth daily.     traMADol (ULTRAM) 50 MG tablet Take 1 tablet by mouth every 8 (eight) hours as needed. (Patient not taking: Reported on 12/14/2019)     triamterene-hydrochlorothiazide (DYAZIDE) 37.5-25 MG capsule Take 1 capsule by mouth daily.   1   warfarin (COUMADIN) 10 MG tablet Take 10mg  by mouth four times weekly or as directed.  2   No current facility-administered medications on file prior to visit.   No Known Allergies  Recent Results (from the past 2160 hour(s))  Protime-INR     Status: None   Collection Time: 12/14/19 12:00 AM  Result Value Ref Range   INR    Protime-INR     Status: None   Collection Time: 12/26/19 12:00 AM  Result Value Ref Range   INR      Objective: There were no vitals filed for this visit.  General: Patient is awake, alert, oriented x 3 and in no acute distress.  Dermatology: Skin is warm and dry bilateral with a full thickness ulceration present  Right central heel. Ulceration measures 0.8 cm x 0.6 cm with no sinus tract. There is a mildly keratotic border with a granular base. The ulceration no longer probes, there is no malodor, minimal active drainage, no erythema, no edema. No acute signs of infection.  Vascular: Dorsalis Pedis pulse = 1/4 Bilateral,  Posterior Tibial pulse = 1/4 Bilateral,  Capillary Fill Time < 5 seconds  Neurologic: Protective sensation absent bilateral.  Musculosketal: There is no pain to palpation to ulcerated area at right heel.   No results for input(s): GRAMSTAIN, LABORGA in the last 8760 hours.  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 -Cleansed ulceration and using a  sterile tissue nipper minimally debrided the margins of the right heel ulcer and then utilizing a Q-tip applied PRISMA the wound bed are covered with dry dressing.  Advised patient and wife to continue with doing packing dressing ateast every other day with assistance from home nursing. -Continue with heel offloading, applied heel cushion to tennis shoe at today's visit  -Continue with PT to help with strengthening the legs and advised wife that she does not need to try to get him to walk or do much when she's by herself to prevent against falls - Advised patient to go to the ER or return to office if the wound worsens or if constitutional symptoms are present. -Patient to return to office in 2-3weeks for follow up care and evaluation or sooner if problems arise.  Landis Martins, DPM

## 2020-02-03 ENCOUNTER — Telehealth: Payer: Self-pay

## 2020-02-03 DIAGNOSIS — L97416 Non-pressure chronic ulcer of right heel and midfoot with bone involvement without evidence of necrosis: Secondary | ICD-10-CM | POA: Diagnosis not present

## 2020-02-03 DIAGNOSIS — M86171 Other acute osteomyelitis, right ankle and foot: Secondary | ICD-10-CM | POA: Diagnosis not present

## 2020-02-03 DIAGNOSIS — I482 Chronic atrial fibrillation, unspecified: Secondary | ICD-10-CM | POA: Diagnosis not present

## 2020-02-03 DIAGNOSIS — M109 Gout, unspecified: Secondary | ICD-10-CM | POA: Diagnosis not present

## 2020-02-03 DIAGNOSIS — E1169 Type 2 diabetes mellitus with other specified complication: Secondary | ICD-10-CM | POA: Diagnosis not present

## 2020-02-03 DIAGNOSIS — Z8673 Personal history of transient ischemic attack (TIA), and cerebral infarction without residual deficits: Secondary | ICD-10-CM | POA: Diagnosis not present

## 2020-02-03 DIAGNOSIS — E785 Hyperlipidemia, unspecified: Secondary | ICD-10-CM | POA: Diagnosis not present

## 2020-02-03 DIAGNOSIS — Z792 Long term (current) use of antibiotics: Secondary | ICD-10-CM | POA: Diagnosis not present

## 2020-02-03 DIAGNOSIS — E1151 Type 2 diabetes mellitus with diabetic peripheral angiopathy without gangrene: Secondary | ICD-10-CM | POA: Diagnosis not present

## 2020-02-03 DIAGNOSIS — J9811 Atelectasis: Secondary | ICD-10-CM | POA: Diagnosis not present

## 2020-02-03 DIAGNOSIS — Z7901 Long term (current) use of anticoagulants: Secondary | ICD-10-CM | POA: Diagnosis not present

## 2020-02-03 DIAGNOSIS — Z7982 Long term (current) use of aspirin: Secondary | ICD-10-CM | POA: Diagnosis not present

## 2020-02-03 DIAGNOSIS — E11621 Type 2 diabetes mellitus with foot ulcer: Secondary | ICD-10-CM | POA: Diagnosis not present

## 2020-02-03 DIAGNOSIS — I10 Essential (primary) hypertension: Secondary | ICD-10-CM | POA: Diagnosis not present

## 2020-02-03 DIAGNOSIS — Z452 Encounter for adjustment and management of vascular access device: Secondary | ICD-10-CM | POA: Diagnosis not present

## 2020-02-03 NOTE — Telephone Encounter (Signed)
Pricilla from [facility] called requesting verbal orders for pt to do precer for next 60 days. They will be seeing pt once/wk for next 9 weeks.  Contacted Pricilla back and gave approval per Dr. Cannon Kettle

## 2020-02-06 DIAGNOSIS — I152 Hypertension secondary to endocrine disorders: Secondary | ICD-10-CM | POA: Diagnosis not present

## 2020-02-07 DIAGNOSIS — I482 Chronic atrial fibrillation, unspecified: Secondary | ICD-10-CM | POA: Diagnosis not present

## 2020-02-07 DIAGNOSIS — I152 Hypertension secondary to endocrine disorders: Secondary | ICD-10-CM | POA: Diagnosis not present

## 2020-02-07 DIAGNOSIS — E1159 Type 2 diabetes mellitus with other circulatory complications: Secondary | ICD-10-CM | POA: Diagnosis not present

## 2020-02-08 DIAGNOSIS — E1151 Type 2 diabetes mellitus with diabetic peripheral angiopathy without gangrene: Secondary | ICD-10-CM | POA: Diagnosis not present

## 2020-02-08 DIAGNOSIS — Z7982 Long term (current) use of aspirin: Secondary | ICD-10-CM | POA: Diagnosis not present

## 2020-02-08 DIAGNOSIS — Z792 Long term (current) use of antibiotics: Secondary | ICD-10-CM | POA: Diagnosis not present

## 2020-02-08 DIAGNOSIS — E11621 Type 2 diabetes mellitus with foot ulcer: Secondary | ICD-10-CM | POA: Diagnosis not present

## 2020-02-08 DIAGNOSIS — I10 Essential (primary) hypertension: Secondary | ICD-10-CM | POA: Diagnosis not present

## 2020-02-08 DIAGNOSIS — E785 Hyperlipidemia, unspecified: Secondary | ICD-10-CM | POA: Diagnosis not present

## 2020-02-08 DIAGNOSIS — E1169 Type 2 diabetes mellitus with other specified complication: Secondary | ICD-10-CM | POA: Diagnosis not present

## 2020-02-08 DIAGNOSIS — Z7901 Long term (current) use of anticoagulants: Secondary | ICD-10-CM | POA: Diagnosis not present

## 2020-02-08 DIAGNOSIS — M109 Gout, unspecified: Secondary | ICD-10-CM | POA: Diagnosis not present

## 2020-02-08 DIAGNOSIS — I482 Chronic atrial fibrillation, unspecified: Secondary | ICD-10-CM | POA: Diagnosis not present

## 2020-02-08 DIAGNOSIS — Z452 Encounter for adjustment and management of vascular access device: Secondary | ICD-10-CM | POA: Diagnosis not present

## 2020-02-08 DIAGNOSIS — Z8673 Personal history of transient ischemic attack (TIA), and cerebral infarction without residual deficits: Secondary | ICD-10-CM | POA: Diagnosis not present

## 2020-02-08 DIAGNOSIS — L97416 Non-pressure chronic ulcer of right heel and midfoot with bone involvement without evidence of necrosis: Secondary | ICD-10-CM | POA: Diagnosis not present

## 2020-02-08 DIAGNOSIS — M86171 Other acute osteomyelitis, right ankle and foot: Secondary | ICD-10-CM | POA: Diagnosis not present

## 2020-02-08 DIAGNOSIS — J9811 Atelectasis: Secondary | ICD-10-CM | POA: Diagnosis not present

## 2020-02-09 DIAGNOSIS — M86171 Other acute osteomyelitis, right ankle and foot: Secondary | ICD-10-CM | POA: Diagnosis not present

## 2020-02-09 DIAGNOSIS — I482 Chronic atrial fibrillation, unspecified: Secondary | ICD-10-CM | POA: Diagnosis not present

## 2020-02-09 DIAGNOSIS — L97416 Non-pressure chronic ulcer of right heel and midfoot with bone involvement without evidence of necrosis: Secondary | ICD-10-CM | POA: Diagnosis not present

## 2020-02-09 DIAGNOSIS — E1151 Type 2 diabetes mellitus with diabetic peripheral angiopathy without gangrene: Secondary | ICD-10-CM | POA: Diagnosis not present

## 2020-02-09 DIAGNOSIS — Z7901 Long term (current) use of anticoagulants: Secondary | ICD-10-CM | POA: Diagnosis not present

## 2020-02-09 DIAGNOSIS — Z7982 Long term (current) use of aspirin: Secondary | ICD-10-CM | POA: Diagnosis not present

## 2020-02-09 DIAGNOSIS — E1169 Type 2 diabetes mellitus with other specified complication: Secondary | ICD-10-CM | POA: Diagnosis not present

## 2020-02-09 DIAGNOSIS — E785 Hyperlipidemia, unspecified: Secondary | ICD-10-CM | POA: Diagnosis not present

## 2020-02-09 DIAGNOSIS — Z452 Encounter for adjustment and management of vascular access device: Secondary | ICD-10-CM | POA: Diagnosis not present

## 2020-02-09 DIAGNOSIS — M109 Gout, unspecified: Secondary | ICD-10-CM | POA: Diagnosis not present

## 2020-02-09 DIAGNOSIS — J9811 Atelectasis: Secondary | ICD-10-CM | POA: Diagnosis not present

## 2020-02-09 DIAGNOSIS — E11621 Type 2 diabetes mellitus with foot ulcer: Secondary | ICD-10-CM | POA: Diagnosis not present

## 2020-02-09 DIAGNOSIS — Z792 Long term (current) use of antibiotics: Secondary | ICD-10-CM | POA: Diagnosis not present

## 2020-02-09 DIAGNOSIS — Z8673 Personal history of transient ischemic attack (TIA), and cerebral infarction without residual deficits: Secondary | ICD-10-CM | POA: Diagnosis not present

## 2020-02-09 DIAGNOSIS — I10 Essential (primary) hypertension: Secondary | ICD-10-CM | POA: Diagnosis not present

## 2020-02-14 DIAGNOSIS — Z8673 Personal history of transient ischemic attack (TIA), and cerebral infarction without residual deficits: Secondary | ICD-10-CM | POA: Diagnosis not present

## 2020-02-14 DIAGNOSIS — Z792 Long term (current) use of antibiotics: Secondary | ICD-10-CM | POA: Diagnosis not present

## 2020-02-14 DIAGNOSIS — M109 Gout, unspecified: Secondary | ICD-10-CM | POA: Diagnosis not present

## 2020-02-14 DIAGNOSIS — Z452 Encounter for adjustment and management of vascular access device: Secondary | ICD-10-CM | POA: Diagnosis not present

## 2020-02-14 DIAGNOSIS — L97416 Non-pressure chronic ulcer of right heel and midfoot with bone involvement without evidence of necrosis: Secondary | ICD-10-CM | POA: Diagnosis not present

## 2020-02-14 DIAGNOSIS — I482 Chronic atrial fibrillation, unspecified: Secondary | ICD-10-CM | POA: Diagnosis not present

## 2020-02-14 DIAGNOSIS — J9811 Atelectasis: Secondary | ICD-10-CM | POA: Diagnosis not present

## 2020-02-14 DIAGNOSIS — Z7901 Long term (current) use of anticoagulants: Secondary | ICD-10-CM | POA: Diagnosis not present

## 2020-02-14 DIAGNOSIS — Z7982 Long term (current) use of aspirin: Secondary | ICD-10-CM | POA: Diagnosis not present

## 2020-02-14 DIAGNOSIS — M86171 Other acute osteomyelitis, right ankle and foot: Secondary | ICD-10-CM | POA: Diagnosis not present

## 2020-02-14 DIAGNOSIS — E1169 Type 2 diabetes mellitus with other specified complication: Secondary | ICD-10-CM | POA: Diagnosis not present

## 2020-02-14 DIAGNOSIS — I10 Essential (primary) hypertension: Secondary | ICD-10-CM | POA: Diagnosis not present

## 2020-02-14 DIAGNOSIS — E785 Hyperlipidemia, unspecified: Secondary | ICD-10-CM | POA: Diagnosis not present

## 2020-02-14 DIAGNOSIS — E1151 Type 2 diabetes mellitus with diabetic peripheral angiopathy without gangrene: Secondary | ICD-10-CM | POA: Diagnosis not present

## 2020-02-14 DIAGNOSIS — E11621 Type 2 diabetes mellitus with foot ulcer: Secondary | ICD-10-CM | POA: Diagnosis not present

## 2020-02-15 ENCOUNTER — Other Ambulatory Visit: Payer: Self-pay

## 2020-02-15 ENCOUNTER — Encounter: Payer: Self-pay | Admitting: Sports Medicine

## 2020-02-15 ENCOUNTER — Ambulatory Visit: Payer: Medicare Other | Admitting: Sports Medicine

## 2020-02-15 DIAGNOSIS — E1142 Type 2 diabetes mellitus with diabetic polyneuropathy: Secondary | ICD-10-CM

## 2020-02-15 DIAGNOSIS — Z7901 Long term (current) use of anticoagulants: Secondary | ICD-10-CM

## 2020-02-15 DIAGNOSIS — L97411 Non-pressure chronic ulcer of right heel and midfoot limited to breakdown of skin: Secondary | ICD-10-CM | POA: Diagnosis not present

## 2020-02-15 DIAGNOSIS — Z5181 Encounter for therapeutic drug level monitoring: Secondary | ICD-10-CM | POA: Diagnosis not present

## 2020-02-15 DIAGNOSIS — I482 Chronic atrial fibrillation, unspecified: Secondary | ICD-10-CM | POA: Diagnosis not present

## 2020-02-15 NOTE — Progress Notes (Signed)
Subjective: Chad Burgess is a 79 y.o. male patient seen in office for evaluation of ulceration of the right heel.  Wife reports that the wound is looking better still a little hole to the area with soreness from time to time at the right heel. Patient denies any pain, redness, warmth, swelling or any other symptoms at this time.   Patient Active Problem List   Diagnosis Date Noted  . Open wound of heel, right, initial encounter 11/15/2019  . 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 Diabetes mellitus ([place]) 07/13/2017  . HTN (hypertension) 07/13/2017  . Dyslipidemia 07/13/2017   Current Outpatient Medications on File Prior to Visit  Medication Sig Dispense Refill  . allopurinol (ZYLOPRIM) 300 MG tablet Take 1 tablet by mouth daily.    . cloNIDine (CATAPRES) 0.2 MG tablet TAKE 1 TABLET (0.2mg ) BY MOUTH EVERYDAY AT BEDTIME  4  . colchicine 0.6 MG tablet Take 1 tablet (0.6 mg total) by mouth daily. During gout flare up 90 tablet 0  . collagenase (SANTYL) ointment Apply 1 application topically daily. Right heel wound measures 2 x 3cm. 30 g 5  . digoxin (LANOXIN) 0.25 MG tablet Take 250 mcg by mouth daily.  4  . diltiazem (DILT-XR) 120 MG 24 hr capsule Take 1 capsule by mouth daily.    . ertapenem (INVANZ) IVPB Inject 1 g into the vein. Daily through PICC line    . febuxostat (ULORIC) 40 MG tablet Take 40 mg by mouth daily.    . fenofibrate 160 MG tablet Take 160 mg by mouth daily.  4  . Fluticasone-Umeclidin-Vilant (TRELEGY ELLIPTA) 100-62.5-25 MCG/INH AEPB INHALE 1 PUFF BY MOUTH DAILY    . lisinopril (PRINIVIL,ZESTRIL) 40 MG tablet Take 40 mg by mouth 2 (two) times daily.  4  . metFORMIN (GLUCOPHAGE) 1000 MG tablet Take 1,000 mg by mouth 2 (two) times daily.  1  . methylPREDNISolone (MEDROL DOSEPAK) 4 MG TBPK tablet 6 Day Taper Pack. Take as Directed. 21 tablet 0  . metoprolol tartrate (LOPRESSOR) 100 MG tablet Take 1 tablet (100mg ) by mouth once daily.  1  . spironolactone (ALDACTONE)  25 MG tablet Take 25 mg by mouth daily.    . traMADol (ULTRAM) 50 MG tablet Take 1 tablet by mouth every 8 (eight) hours as needed. (Patient not taking: Reported on 12/14/2019)    . triamterene-hydrochlorothiazide (DYAZIDE) 37.5-25 MG capsule Take 1 capsule by mouth daily.   1  . warfarin (COUMADIN) 10 MG tablet Take 10mg  by mouth four times weekly or as directed.  2   No current facility-administered medications on file prior to visit.   No Known Allergies  Recent Results (from the past 2160 hour(s))  Protime-INR     Status: None   Collection Time: 12/14/19 12:00 AM  Result Value Ref Range   INR    Protime-INR     Status: None   Collection Time: 12/26/19 12:00 AM  Result Value Ref Range   INR      Objective: There were no vitals filed for this visit.  General: Patient is awake, alert, oriented x 3 and in no acute distress.  Dermatology: Skin is warm and dry bilateral with a full thickness ulceration present  Right central heel. Ulceration measures  0.4 cm x 0.3 cm with granular base no sinus tract. There is a mildly keratotic border with a small amount of maceration.. The ulceration no longer probes, there is no malodor, minimal active drainage, no erythema, no edema. No acute  signs of infection.   Vascular: Dorsalis Pedis pulse = 1/4 Bilateral,  Posterior Tibial pulse = 1/4 Bilateral,  Capillary Fill Time < 5 seconds  Neurologic: Protective sensation absent bilateral.  Musculosketal: There is no pain to palpation to ulcerated area at right heel.   No results for input(s): GRAMSTAIN, LABORGA in the last 8760 hours.  Assessment and Plan:  Problem List Items Addressed This Visit      Endocrine   Diabetes mellitus ([place])    Other Visit Diagnoses    Skin ulcer of right heel, limited to breakdown of skin ([place])    -  Primary   Long term current use of anticoagulant          -Examined patient and discussed the progression of the wound and treatment alternatives. -Cleansed  ulceration and using a sterile tissue nipper minimally debrided the margins of the right heel ulcer and then utilizing a Q-tip applied Iodosorb the wound bed are covered with dry dressing.  Advised patient and wife t to discontinue use of Prisma and to be very careful when she is removing the dressings to prevent accidentally cutting the patient like she did last week and continue with antibiotic cream to the anterior ankle for this abrasion -Continue with heel offloading, and to change on next week -Continue with PT to help with strengthening the legs and advised wife that she does not need to try to get him to walk or do much when she's by herself to prevent against falls like before - Advised patient to go to the ER or return to office if the wound worsens or if constitutional symptoms are present. -Patient to return to office in 2-3weeks for follow up care and evaluation or sooner if problems arise.  Landis Martins, DPM

## 2020-02-16 DIAGNOSIS — I10 Essential (primary) hypertension: Secondary | ICD-10-CM | POA: Diagnosis not present

## 2020-02-16 DIAGNOSIS — Z792 Long term (current) use of antibiotics: Secondary | ICD-10-CM | POA: Diagnosis not present

## 2020-02-16 DIAGNOSIS — M86171 Other acute osteomyelitis, right ankle and foot: Secondary | ICD-10-CM | POA: Diagnosis not present

## 2020-02-16 DIAGNOSIS — E11621 Type 2 diabetes mellitus with foot ulcer: Secondary | ICD-10-CM | POA: Diagnosis not present

## 2020-02-16 DIAGNOSIS — Z7982 Long term (current) use of aspirin: Secondary | ICD-10-CM | POA: Diagnosis not present

## 2020-02-16 DIAGNOSIS — I482 Chronic atrial fibrillation, unspecified: Secondary | ICD-10-CM | POA: Diagnosis not present

## 2020-02-16 DIAGNOSIS — E1169 Type 2 diabetes mellitus with other specified complication: Secondary | ICD-10-CM | POA: Diagnosis not present

## 2020-02-16 DIAGNOSIS — L97416 Non-pressure chronic ulcer of right heel and midfoot with bone involvement without evidence of necrosis: Secondary | ICD-10-CM | POA: Diagnosis not present

## 2020-02-16 DIAGNOSIS — E1151 Type 2 diabetes mellitus with diabetic peripheral angiopathy without gangrene: Secondary | ICD-10-CM | POA: Diagnosis not present

## 2020-02-16 DIAGNOSIS — Z7901 Long term (current) use of anticoagulants: Secondary | ICD-10-CM | POA: Diagnosis not present

## 2020-02-16 DIAGNOSIS — Z8673 Personal history of transient ischemic attack (TIA), and cerebral infarction without residual deficits: Secondary | ICD-10-CM | POA: Diagnosis not present

## 2020-02-16 DIAGNOSIS — Z452 Encounter for adjustment and management of vascular access device: Secondary | ICD-10-CM | POA: Diagnosis not present

## 2020-02-16 DIAGNOSIS — M109 Gout, unspecified: Secondary | ICD-10-CM | POA: Diagnosis not present

## 2020-02-16 DIAGNOSIS — J9811 Atelectasis: Secondary | ICD-10-CM | POA: Diagnosis not present

## 2020-02-16 DIAGNOSIS — E785 Hyperlipidemia, unspecified: Secondary | ICD-10-CM | POA: Diagnosis not present

## 2020-02-22 DIAGNOSIS — Z8673 Personal history of transient ischemic attack (TIA), and cerebral infarction without residual deficits: Secondary | ICD-10-CM | POA: Diagnosis not present

## 2020-02-22 DIAGNOSIS — Z7982 Long term (current) use of aspirin: Secondary | ICD-10-CM | POA: Diagnosis not present

## 2020-02-22 DIAGNOSIS — E1169 Type 2 diabetes mellitus with other specified complication: Secondary | ICD-10-CM | POA: Diagnosis not present

## 2020-02-22 DIAGNOSIS — Z452 Encounter for adjustment and management of vascular access device: Secondary | ICD-10-CM | POA: Diagnosis not present

## 2020-02-22 DIAGNOSIS — E11621 Type 2 diabetes mellitus with foot ulcer: Secondary | ICD-10-CM | POA: Diagnosis not present

## 2020-02-22 DIAGNOSIS — E1151 Type 2 diabetes mellitus with diabetic peripheral angiopathy without gangrene: Secondary | ICD-10-CM | POA: Diagnosis not present

## 2020-02-22 DIAGNOSIS — Z7901 Long term (current) use of anticoagulants: Secondary | ICD-10-CM | POA: Diagnosis not present

## 2020-02-22 DIAGNOSIS — E785 Hyperlipidemia, unspecified: Secondary | ICD-10-CM | POA: Diagnosis not present

## 2020-02-22 DIAGNOSIS — Z792 Long term (current) use of antibiotics: Secondary | ICD-10-CM | POA: Diagnosis not present

## 2020-02-22 DIAGNOSIS — I10 Essential (primary) hypertension: Secondary | ICD-10-CM | POA: Diagnosis not present

## 2020-02-22 DIAGNOSIS — I482 Chronic atrial fibrillation, unspecified: Secondary | ICD-10-CM | POA: Diagnosis not present

## 2020-02-22 DIAGNOSIS — J9811 Atelectasis: Secondary | ICD-10-CM | POA: Diagnosis not present

## 2020-02-22 DIAGNOSIS — M86171 Other acute osteomyelitis, right ankle and foot: Secondary | ICD-10-CM | POA: Diagnosis not present

## 2020-02-22 DIAGNOSIS — M109 Gout, unspecified: Secondary | ICD-10-CM | POA: Diagnosis not present

## 2020-02-22 DIAGNOSIS — L97416 Non-pressure chronic ulcer of right heel and midfoot with bone involvement without evidence of necrosis: Secondary | ICD-10-CM | POA: Diagnosis not present

## 2020-02-23 DIAGNOSIS — E1169 Type 2 diabetes mellitus with other specified complication: Secondary | ICD-10-CM | POA: Diagnosis not present

## 2020-02-23 DIAGNOSIS — I10 Essential (primary) hypertension: Secondary | ICD-10-CM | POA: Diagnosis not present

## 2020-02-23 DIAGNOSIS — Z7982 Long term (current) use of aspirin: Secondary | ICD-10-CM | POA: Diagnosis not present

## 2020-02-23 DIAGNOSIS — E785 Hyperlipidemia, unspecified: Secondary | ICD-10-CM | POA: Diagnosis not present

## 2020-02-23 DIAGNOSIS — Z792 Long term (current) use of antibiotics: Secondary | ICD-10-CM | POA: Diagnosis not present

## 2020-02-23 DIAGNOSIS — Z8673 Personal history of transient ischemic attack (TIA), and cerebral infarction without residual deficits: Secondary | ICD-10-CM | POA: Diagnosis not present

## 2020-02-23 DIAGNOSIS — E11621 Type 2 diabetes mellitus with foot ulcer: Secondary | ICD-10-CM | POA: Diagnosis not present

## 2020-02-23 DIAGNOSIS — J9811 Atelectasis: Secondary | ICD-10-CM | POA: Diagnosis not present

## 2020-02-23 DIAGNOSIS — Z452 Encounter for adjustment and management of vascular access device: Secondary | ICD-10-CM | POA: Diagnosis not present

## 2020-02-23 DIAGNOSIS — Z7901 Long term (current) use of anticoagulants: Secondary | ICD-10-CM | POA: Diagnosis not present

## 2020-02-23 DIAGNOSIS — M109 Gout, unspecified: Secondary | ICD-10-CM | POA: Diagnosis not present

## 2020-02-23 DIAGNOSIS — M86171 Other acute osteomyelitis, right ankle and foot: Secondary | ICD-10-CM | POA: Diagnosis not present

## 2020-02-23 DIAGNOSIS — L97416 Non-pressure chronic ulcer of right heel and midfoot with bone involvement without evidence of necrosis: Secondary | ICD-10-CM | POA: Diagnosis not present

## 2020-02-23 DIAGNOSIS — I482 Chronic atrial fibrillation, unspecified: Secondary | ICD-10-CM | POA: Diagnosis not present

## 2020-02-23 DIAGNOSIS — E1151 Type 2 diabetes mellitus with diabetic peripheral angiopathy without gangrene: Secondary | ICD-10-CM | POA: Diagnosis not present

## 2020-02-29 DIAGNOSIS — Z7901 Long term (current) use of anticoagulants: Secondary | ICD-10-CM | POA: Diagnosis not present

## 2020-02-29 DIAGNOSIS — E11621 Type 2 diabetes mellitus with foot ulcer: Secondary | ICD-10-CM | POA: Diagnosis not present

## 2020-02-29 DIAGNOSIS — J9811 Atelectasis: Secondary | ICD-10-CM | POA: Diagnosis not present

## 2020-02-29 DIAGNOSIS — E1151 Type 2 diabetes mellitus with diabetic peripheral angiopathy without gangrene: Secondary | ICD-10-CM | POA: Diagnosis not present

## 2020-02-29 DIAGNOSIS — M86171 Other acute osteomyelitis, right ankle and foot: Secondary | ICD-10-CM | POA: Diagnosis not present

## 2020-02-29 DIAGNOSIS — M109 Gout, unspecified: Secondary | ICD-10-CM | POA: Diagnosis not present

## 2020-02-29 DIAGNOSIS — I482 Chronic atrial fibrillation, unspecified: Secondary | ICD-10-CM | POA: Diagnosis not present

## 2020-02-29 DIAGNOSIS — E785 Hyperlipidemia, unspecified: Secondary | ICD-10-CM | POA: Diagnosis not present

## 2020-02-29 DIAGNOSIS — L97416 Non-pressure chronic ulcer of right heel and midfoot with bone involvement without evidence of necrosis: Secondary | ICD-10-CM | POA: Diagnosis not present

## 2020-02-29 DIAGNOSIS — E1169 Type 2 diabetes mellitus with other specified complication: Secondary | ICD-10-CM | POA: Diagnosis not present

## 2020-02-29 DIAGNOSIS — Z792 Long term (current) use of antibiotics: Secondary | ICD-10-CM | POA: Diagnosis not present

## 2020-02-29 DIAGNOSIS — Z8673 Personal history of transient ischemic attack (TIA), and cerebral infarction without residual deficits: Secondary | ICD-10-CM | POA: Diagnosis not present

## 2020-02-29 DIAGNOSIS — Z452 Encounter for adjustment and management of vascular access device: Secondary | ICD-10-CM | POA: Diagnosis not present

## 2020-02-29 DIAGNOSIS — I10 Essential (primary) hypertension: Secondary | ICD-10-CM | POA: Diagnosis not present

## 2020-02-29 DIAGNOSIS — Z7982 Long term (current) use of aspirin: Secondary | ICD-10-CM | POA: Diagnosis not present

## 2020-03-01 DIAGNOSIS — Z8673 Personal history of transient ischemic attack (TIA), and cerebral infarction without residual deficits: Secondary | ICD-10-CM | POA: Diagnosis not present

## 2020-03-01 DIAGNOSIS — Z7901 Long term (current) use of anticoagulants: Secondary | ICD-10-CM | POA: Diagnosis not present

## 2020-03-01 DIAGNOSIS — M86171 Other acute osteomyelitis, right ankle and foot: Secondary | ICD-10-CM | POA: Diagnosis not present

## 2020-03-01 DIAGNOSIS — Z792 Long term (current) use of antibiotics: Secondary | ICD-10-CM | POA: Diagnosis not present

## 2020-03-01 DIAGNOSIS — L97416 Non-pressure chronic ulcer of right heel and midfoot with bone involvement without evidence of necrosis: Secondary | ICD-10-CM | POA: Diagnosis not present

## 2020-03-01 DIAGNOSIS — E11621 Type 2 diabetes mellitus with foot ulcer: Secondary | ICD-10-CM | POA: Diagnosis not present

## 2020-03-01 DIAGNOSIS — I10 Essential (primary) hypertension: Secondary | ICD-10-CM | POA: Diagnosis not present

## 2020-03-01 DIAGNOSIS — J9811 Atelectasis: Secondary | ICD-10-CM | POA: Diagnosis not present

## 2020-03-01 DIAGNOSIS — E1151 Type 2 diabetes mellitus with diabetic peripheral angiopathy without gangrene: Secondary | ICD-10-CM | POA: Diagnosis not present

## 2020-03-01 DIAGNOSIS — M109 Gout, unspecified: Secondary | ICD-10-CM | POA: Diagnosis not present

## 2020-03-01 DIAGNOSIS — Z452 Encounter for adjustment and management of vascular access device: Secondary | ICD-10-CM | POA: Diagnosis not present

## 2020-03-01 DIAGNOSIS — E1169 Type 2 diabetes mellitus with other specified complication: Secondary | ICD-10-CM | POA: Diagnosis not present

## 2020-03-01 DIAGNOSIS — E785 Hyperlipidemia, unspecified: Secondary | ICD-10-CM | POA: Diagnosis not present

## 2020-03-01 DIAGNOSIS — Z7982 Long term (current) use of aspirin: Secondary | ICD-10-CM | POA: Diagnosis not present

## 2020-03-01 DIAGNOSIS — I482 Chronic atrial fibrillation, unspecified: Secondary | ICD-10-CM | POA: Diagnosis not present

## 2020-03-06 ENCOUNTER — Telehealth: Payer: Self-pay | Admitting: *Deleted

## 2020-03-06 ENCOUNTER — Ambulatory Visit (INDEPENDENT_AMBULATORY_CARE_PROVIDER_SITE_OTHER): Payer: Medicare Other | Admitting: Sports Medicine

## 2020-03-06 ENCOUNTER — Other Ambulatory Visit: Payer: Self-pay

## 2020-03-06 ENCOUNTER — Encounter: Payer: Self-pay | Admitting: Sports Medicine

## 2020-03-06 DIAGNOSIS — M86171 Other acute osteomyelitis, right ankle and foot: Secondary | ICD-10-CM | POA: Diagnosis not present

## 2020-03-06 DIAGNOSIS — E1142 Type 2 diabetes mellitus with diabetic polyneuropathy: Secondary | ICD-10-CM

## 2020-03-06 DIAGNOSIS — I482 Chronic atrial fibrillation, unspecified: Secondary | ICD-10-CM | POA: Diagnosis not present

## 2020-03-06 DIAGNOSIS — I10 Essential (primary) hypertension: Secondary | ICD-10-CM | POA: Diagnosis not present

## 2020-03-06 DIAGNOSIS — E11621 Type 2 diabetes mellitus with foot ulcer: Secondary | ICD-10-CM | POA: Diagnosis not present

## 2020-03-06 DIAGNOSIS — E1151 Type 2 diabetes mellitus with diabetic peripheral angiopathy without gangrene: Secondary | ICD-10-CM | POA: Diagnosis not present

## 2020-03-06 DIAGNOSIS — E785 Hyperlipidemia, unspecified: Secondary | ICD-10-CM | POA: Diagnosis not present

## 2020-03-06 DIAGNOSIS — M792 Neuralgia and neuritis, unspecified: Secondary | ICD-10-CM

## 2020-03-06 DIAGNOSIS — M109 Gout, unspecified: Secondary | ICD-10-CM | POA: Diagnosis not present

## 2020-03-06 DIAGNOSIS — L97411 Non-pressure chronic ulcer of right heel and midfoot limited to breakdown of skin: Secondary | ICD-10-CM | POA: Diagnosis not present

## 2020-03-06 DIAGNOSIS — Z8673 Personal history of transient ischemic attack (TIA), and cerebral infarction without residual deficits: Secondary | ICD-10-CM | POA: Diagnosis not present

## 2020-03-06 DIAGNOSIS — Z7982 Long term (current) use of aspirin: Secondary | ICD-10-CM | POA: Diagnosis not present

## 2020-03-06 DIAGNOSIS — Z792 Long term (current) use of antibiotics: Secondary | ICD-10-CM | POA: Diagnosis not present

## 2020-03-06 DIAGNOSIS — Z452 Encounter for adjustment and management of vascular access device: Secondary | ICD-10-CM | POA: Diagnosis not present

## 2020-03-06 DIAGNOSIS — J9811 Atelectasis: Secondary | ICD-10-CM | POA: Diagnosis not present

## 2020-03-06 DIAGNOSIS — E1169 Type 2 diabetes mellitus with other specified complication: Secondary | ICD-10-CM | POA: Diagnosis not present

## 2020-03-06 DIAGNOSIS — L97416 Non-pressure chronic ulcer of right heel and midfoot with bone involvement without evidence of necrosis: Secondary | ICD-10-CM | POA: Diagnosis not present

## 2020-03-06 DIAGNOSIS — Z7901 Long term (current) use of anticoagulants: Secondary | ICD-10-CM

## 2020-03-06 NOTE — Progress Notes (Signed)
Subjective: Chad Burgess is a 79 y.o. male patient seen in office for evaluation of ulceration of the right heel.  Wife reports that the right heel is looking good got out into the yard and on his 4 wheeler over the weekend and reports that there has been an increase and sharp shooting pain to the right foot that he has been complaining about. Patient denies any other pain, redness, warmth, swelling or any other symptoms at this time.   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     cloNIDine (CATAPRES) 0.2 MG tablet TAKE 1 TABLET (0.2mg ) BY MOUTH EVERYDAY AT BEDTIME  4   colchicine 0.6 MG tablet Take 1 tablet (0.6 mg total) by mouth daily. During gout flare up 90 tablet 0   collagenase (SANTYL) ointment Apply 1 application topically daily. Right heel wound measures 2 x 3cm. 30 g 5   digoxin (LANOXIN) 0.25 MG tablet Take 250 mcg by mouth daily.  4   diltiazem (DILT-XR) 120 MG 24 hr capsule Take 1 capsule by mouth daily.     ertapenem (INVANZ) IVPB Inject 1 g into the vein. Daily through PICC line     febuxostat (ULORIC) 40 MG tablet Take 40 mg by mouth daily.     fenofibrate 160 MG tablet Take 160 mg by mouth daily.  4   Fluticasone-Umeclidin-Vilant (TRELEGY ELLIPTA) 100-62.5-25 MCG/INH AEPB INHALE 1 PUFF BY MOUTH DAILY     lisinopril (PRINIVIL,ZESTRIL) 40 MG tablet Take 40 mg by mouth 2 (two) times daily.  4   metFORMIN (GLUCOPHAGE) 1000 MG tablet Take 1,000 mg by mouth 2 (two) times daily.  1   methylPREDNISolone (MEDROL DOSEPAK) 4 MG TBPK tablet 6 Day Taper Pack. Take as Directed. 21 tablet 0   metoprolol  tartrate (LOPRESSOR) 100 MG tablet Take 1 tablet (100mg ) by mouth once daily.  1   spironolactone (ALDACTONE) 25 MG tablet Take 25 mg by mouth daily.     traMADol (ULTRAM) 50 MG tablet Take 1 tablet by mouth every 8 (eight) hours as needed. (Patient not taking: Reported on 12/14/2019)     triamterene-hydrochlorothiazide (DYAZIDE) 37.5-25 MG capsule Take 1 capsule by mouth daily.   1   warfarin (COUMADIN) 10 MG tablet Take 10mg  by mouth four times weekly or as directed.  2   No current facility-administered medications on file prior to visit.   No Known Allergies  Recent Results (from the past 2160 hour(s))  Protime-INR     Status: None   Collection Time: 12/14/19 12:00 AM  Result Value Ref Range   INR    Protime-INR     Status: None   Collection Time: 12/26/19 12:00 AM  Result Value Ref Range   INR      Objective: There were no vitals filed for this visit.  General: Patient is awake, alert, oriented x 3 and in no acute distress.  Dermatology: Skin is warm and dry bilateral with a partial thickness ulceration present  Right central heel. Ulceration measures  0.1 cm x 0.1 cm with granular base no sinus tract. There is a mildly keratotic border with a small amount  of maceration. The ulceration no longer probes, there is no malodor, minimal active drainage, no erythema, no edema. No acute signs of infection.   Vascular: Dorsalis Pedis pulse = 1/4 Bilateral,  Posterior Tibial pulse = 1/4 Bilateral,  Capillary Fill Time < 5 seconds  Neurologic: Protective sensation absent bilateral.  Subjective sharp shooting pain noted to the right lateral foot and to the fourth and fifth toes.  Musculosketal: There is no pain to palpation to ulcerated area at right heel.   No results for input(s): GRAMSTAIN, LABORGA in the last 8760 hours.  Assessment and Plan:  Problem List Items Addressed This Visit      Endocrine   Diabetes mellitus ([place])    Other Visit Diagnoses    Skin ulcer of right  heel, limited to breakdown of skin ([place])    -  Primary   Long term current use of anticoagulant       Neuritis          -Examined patient and discussed the progression of the wound and treatment alternatives. -Cleansed ulceration and using a sterile tissue nipper minimally debrided the margins of the right heel ulcer and applied dry dressing advised patient if there is no drainage to continue with dry dressing however if drainage is noted to resume Iodosorb.  -Applied offloading pad to shoe and advised wife to continue with the same -Gave patient topical lidocaine to apply to the dorsal and lateral right foot for sharp shooting pains -Continue with PT and Rx an upright walker as well as a knee brace -Recommend orthopedic follow-up for right knee at [facility] or [place] patient to go to the ER or return to office if the wound worsens or if constitutional symptoms are present. -Patient to return to office in 2-3weeks for follow up care and evaluation or sooner if problems arise.  Landis Martins, DPM

## 2020-03-06 NOTE — Telephone Encounter (Signed)
Called and spoke with Corene Cornea from [facility] and Dr Cannon Kettle did a RX for a knee scooter and for a knee brace and sent over the RX to Home health at [phone]. Lattie Haw

## 2020-03-06 NOTE — Telephone Encounter (Signed)
-----   Message from Landis Martins, [place] sent at 03/05/2020 12:11 PM EDT ----- Regarding: RE: Yes we can do the walker and knee brace. If they need an Prescription faxed put an Rx pad on my desk and I will sign it tomorrow and also let the therapist know that the patient may weightbear to tolerance with ambulatory aides since his foot is almost healed.  Thanks  Dr. Chauncey Cruel ----- Message ----- From: Viviana Simpler, [place] Sent: 03/05/2020  12:05 PM EDT To: Landis Martins, DPM  Dr Corlis Leak home health Physical therapy called and stated that the patient was needing a taller walker due to he was using his wife's and also wanted to know if we could do a knee brace for the patient as well and I stated I was not sure if we could do a knee brace but I would send you a message and get back to [phone]. Lattie Haw

## 2020-03-06 NOTE — Patient Instructions (Addendum)
Recommend [place] or [place] for Right knee

## 2020-03-08 DIAGNOSIS — I482 Chronic atrial fibrillation, unspecified: Secondary | ICD-10-CM | POA: Diagnosis not present

## 2020-03-08 DIAGNOSIS — E1151 Type 2 diabetes mellitus with diabetic peripheral angiopathy without gangrene: Secondary | ICD-10-CM | POA: Diagnosis not present

## 2020-03-08 DIAGNOSIS — Z7982 Long term (current) use of aspirin: Secondary | ICD-10-CM | POA: Diagnosis not present

## 2020-03-08 DIAGNOSIS — Z452 Encounter for adjustment and management of vascular access device: Secondary | ICD-10-CM | POA: Diagnosis not present

## 2020-03-08 DIAGNOSIS — J9811 Atelectasis: Secondary | ICD-10-CM | POA: Diagnosis not present

## 2020-03-08 DIAGNOSIS — Z792 Long term (current) use of antibiotics: Secondary | ICD-10-CM | POA: Diagnosis not present

## 2020-03-08 DIAGNOSIS — L97416 Non-pressure chronic ulcer of right heel and midfoot with bone involvement without evidence of necrosis: Secondary | ICD-10-CM | POA: Diagnosis not present

## 2020-03-08 DIAGNOSIS — M86171 Other acute osteomyelitis, right ankle and foot: Secondary | ICD-10-CM | POA: Diagnosis not present

## 2020-03-08 DIAGNOSIS — I10 Essential (primary) hypertension: Secondary | ICD-10-CM | POA: Diagnosis not present

## 2020-03-08 DIAGNOSIS — Z8673 Personal history of transient ischemic attack (TIA), and cerebral infarction without residual deficits: Secondary | ICD-10-CM | POA: Diagnosis not present

## 2020-03-08 DIAGNOSIS — E785 Hyperlipidemia, unspecified: Secondary | ICD-10-CM | POA: Diagnosis not present

## 2020-03-08 DIAGNOSIS — Z7901 Long term (current) use of anticoagulants: Secondary | ICD-10-CM | POA: Diagnosis not present

## 2020-03-08 DIAGNOSIS — E1169 Type 2 diabetes mellitus with other specified complication: Secondary | ICD-10-CM | POA: Diagnosis not present

## 2020-03-08 DIAGNOSIS — E11621 Type 2 diabetes mellitus with foot ulcer: Secondary | ICD-10-CM | POA: Diagnosis not present

## 2020-03-08 DIAGNOSIS — M109 Gout, unspecified: Secondary | ICD-10-CM | POA: Diagnosis not present

## 2020-03-09 DIAGNOSIS — E1159 Type 2 diabetes mellitus with other circulatory complications: Secondary | ICD-10-CM | POA: Diagnosis not present

## 2020-03-09 DIAGNOSIS — I482 Chronic atrial fibrillation, unspecified: Secondary | ICD-10-CM | POA: Diagnosis not present

## 2020-03-09 DIAGNOSIS — I152 Hypertension secondary to endocrine disorders: Secondary | ICD-10-CM | POA: Diagnosis not present

## 2020-03-13 DIAGNOSIS — E11621 Type 2 diabetes mellitus with foot ulcer: Secondary | ICD-10-CM | POA: Diagnosis not present

## 2020-03-13 DIAGNOSIS — E1169 Type 2 diabetes mellitus with other specified complication: Secondary | ICD-10-CM | POA: Diagnosis not present

## 2020-03-13 DIAGNOSIS — Z452 Encounter for adjustment and management of vascular access device: Secondary | ICD-10-CM | POA: Diagnosis not present

## 2020-03-13 DIAGNOSIS — J9811 Atelectasis: Secondary | ICD-10-CM | POA: Diagnosis not present

## 2020-03-13 DIAGNOSIS — I10 Essential (primary) hypertension: Secondary | ICD-10-CM | POA: Diagnosis not present

## 2020-03-13 DIAGNOSIS — Z7901 Long term (current) use of anticoagulants: Secondary | ICD-10-CM | POA: Diagnosis not present

## 2020-03-13 DIAGNOSIS — M109 Gout, unspecified: Secondary | ICD-10-CM | POA: Diagnosis not present

## 2020-03-13 DIAGNOSIS — E1151 Type 2 diabetes mellitus with diabetic peripheral angiopathy without gangrene: Secondary | ICD-10-CM | POA: Diagnosis not present

## 2020-03-13 DIAGNOSIS — M86171 Other acute osteomyelitis, right ankle and foot: Secondary | ICD-10-CM | POA: Diagnosis not present

## 2020-03-13 DIAGNOSIS — I482 Chronic atrial fibrillation, unspecified: Secondary | ICD-10-CM | POA: Diagnosis not present

## 2020-03-13 DIAGNOSIS — L97416 Non-pressure chronic ulcer of right heel and midfoot with bone involvement without evidence of necrosis: Secondary | ICD-10-CM | POA: Diagnosis not present

## 2020-03-13 DIAGNOSIS — Z7982 Long term (current) use of aspirin: Secondary | ICD-10-CM | POA: Diagnosis not present

## 2020-03-13 DIAGNOSIS — Z8673 Personal history of transient ischemic attack (TIA), and cerebral infarction without residual deficits: Secondary | ICD-10-CM | POA: Diagnosis not present

## 2020-03-13 DIAGNOSIS — E785 Hyperlipidemia, unspecified: Secondary | ICD-10-CM | POA: Diagnosis not present

## 2020-03-13 DIAGNOSIS — Z792 Long term (current) use of antibiotics: Secondary | ICD-10-CM | POA: Diagnosis not present

## 2020-03-14 DIAGNOSIS — E1169 Type 2 diabetes mellitus with other specified complication: Secondary | ICD-10-CM | POA: Diagnosis not present

## 2020-03-15 DIAGNOSIS — I482 Chronic atrial fibrillation, unspecified: Secondary | ICD-10-CM | POA: Diagnosis not present

## 2020-03-15 DIAGNOSIS — M86171 Other acute osteomyelitis, right ankle and foot: Secondary | ICD-10-CM | POA: Diagnosis not present

## 2020-03-15 DIAGNOSIS — Z7982 Long term (current) use of aspirin: Secondary | ICD-10-CM | POA: Diagnosis not present

## 2020-03-15 DIAGNOSIS — Z452 Encounter for adjustment and management of vascular access device: Secondary | ICD-10-CM | POA: Diagnosis not present

## 2020-03-15 DIAGNOSIS — I10 Essential (primary) hypertension: Secondary | ICD-10-CM | POA: Diagnosis not present

## 2020-03-15 DIAGNOSIS — Z8673 Personal history of transient ischemic attack (TIA), and cerebral infarction without residual deficits: Secondary | ICD-10-CM | POA: Diagnosis not present

## 2020-03-15 DIAGNOSIS — E1169 Type 2 diabetes mellitus with other specified complication: Secondary | ICD-10-CM | POA: Diagnosis not present

## 2020-03-15 DIAGNOSIS — L97416 Non-pressure chronic ulcer of right heel and midfoot with bone involvement without evidence of necrosis: Secondary | ICD-10-CM | POA: Diagnosis not present

## 2020-03-15 DIAGNOSIS — M109 Gout, unspecified: Secondary | ICD-10-CM | POA: Diagnosis not present

## 2020-03-15 DIAGNOSIS — J9811 Atelectasis: Secondary | ICD-10-CM | POA: Diagnosis not present

## 2020-03-15 DIAGNOSIS — Z7901 Long term (current) use of anticoagulants: Secondary | ICD-10-CM | POA: Diagnosis not present

## 2020-03-15 DIAGNOSIS — Z792 Long term (current) use of antibiotics: Secondary | ICD-10-CM | POA: Diagnosis not present

## 2020-03-15 DIAGNOSIS — E11621 Type 2 diabetes mellitus with foot ulcer: Secondary | ICD-10-CM | POA: Diagnosis not present

## 2020-03-15 DIAGNOSIS — E1151 Type 2 diabetes mellitus with diabetic peripheral angiopathy without gangrene: Secondary | ICD-10-CM | POA: Diagnosis not present

## 2020-03-15 DIAGNOSIS — E785 Hyperlipidemia, unspecified: Secondary | ICD-10-CM | POA: Diagnosis not present

## 2020-03-16 DIAGNOSIS — C44329 Squamous cell carcinoma of skin of other parts of face: Secondary | ICD-10-CM | POA: Diagnosis not present

## 2020-03-20 DIAGNOSIS — Z8673 Personal history of transient ischemic attack (TIA), and cerebral infarction without residual deficits: Secondary | ICD-10-CM | POA: Diagnosis not present

## 2020-03-20 DIAGNOSIS — E785 Hyperlipidemia, unspecified: Secondary | ICD-10-CM | POA: Diagnosis not present

## 2020-03-20 DIAGNOSIS — Z7901 Long term (current) use of anticoagulants: Secondary | ICD-10-CM | POA: Diagnosis not present

## 2020-03-20 DIAGNOSIS — L97416 Non-pressure chronic ulcer of right heel and midfoot with bone involvement without evidence of necrosis: Secondary | ICD-10-CM | POA: Diagnosis not present

## 2020-03-20 DIAGNOSIS — M109 Gout, unspecified: Secondary | ICD-10-CM | POA: Diagnosis not present

## 2020-03-20 DIAGNOSIS — E1151 Type 2 diabetes mellitus with diabetic peripheral angiopathy without gangrene: Secondary | ICD-10-CM | POA: Diagnosis not present

## 2020-03-20 DIAGNOSIS — E1169 Type 2 diabetes mellitus with other specified complication: Secondary | ICD-10-CM | POA: Diagnosis not present

## 2020-03-20 DIAGNOSIS — Z792 Long term (current) use of antibiotics: Secondary | ICD-10-CM | POA: Diagnosis not present

## 2020-03-20 DIAGNOSIS — I482 Chronic atrial fibrillation, unspecified: Secondary | ICD-10-CM | POA: Diagnosis not present

## 2020-03-20 DIAGNOSIS — E11621 Type 2 diabetes mellitus with foot ulcer: Secondary | ICD-10-CM | POA: Diagnosis not present

## 2020-03-20 DIAGNOSIS — J9811 Atelectasis: Secondary | ICD-10-CM | POA: Diagnosis not present

## 2020-03-20 DIAGNOSIS — Z7982 Long term (current) use of aspirin: Secondary | ICD-10-CM | POA: Diagnosis not present

## 2020-03-20 DIAGNOSIS — I10 Essential (primary) hypertension: Secondary | ICD-10-CM | POA: Diagnosis not present

## 2020-03-20 DIAGNOSIS — M86171 Other acute osteomyelitis, right ankle and foot: Secondary | ICD-10-CM | POA: Diagnosis not present

## 2020-03-20 DIAGNOSIS — Z452 Encounter for adjustment and management of vascular access device: Secondary | ICD-10-CM | POA: Diagnosis not present

## 2020-03-21 DIAGNOSIS — I482 Chronic atrial fibrillation, unspecified: Secondary | ICD-10-CM | POA: Diagnosis not present

## 2020-03-21 DIAGNOSIS — Z5181 Encounter for therapeutic drug level monitoring: Secondary | ICD-10-CM | POA: Diagnosis not present

## 2020-03-21 DIAGNOSIS — R011 Cardiac murmur, unspecified: Secondary | ICD-10-CM | POA: Diagnosis not present

## 2020-03-21 DIAGNOSIS — I700 Atherosclerosis of aorta: Secondary | ICD-10-CM | POA: Diagnosis not present

## 2020-03-22 DIAGNOSIS — I482 Chronic atrial fibrillation, unspecified: Secondary | ICD-10-CM | POA: Diagnosis not present

## 2020-03-22 DIAGNOSIS — M109 Gout, unspecified: Secondary | ICD-10-CM | POA: Diagnosis not present

## 2020-03-22 DIAGNOSIS — E11621 Type 2 diabetes mellitus with foot ulcer: Secondary | ICD-10-CM | POA: Diagnosis not present

## 2020-03-22 DIAGNOSIS — Z792 Long term (current) use of antibiotics: Secondary | ICD-10-CM | POA: Diagnosis not present

## 2020-03-22 DIAGNOSIS — E1169 Type 2 diabetes mellitus with other specified complication: Secondary | ICD-10-CM | POA: Diagnosis not present

## 2020-03-22 DIAGNOSIS — Z8673 Personal history of transient ischemic attack (TIA), and cerebral infarction without residual deficits: Secondary | ICD-10-CM | POA: Diagnosis not present

## 2020-03-22 DIAGNOSIS — Z452 Encounter for adjustment and management of vascular access device: Secondary | ICD-10-CM | POA: Diagnosis not present

## 2020-03-22 DIAGNOSIS — L97416 Non-pressure chronic ulcer of right heel and midfoot with bone involvement without evidence of necrosis: Secondary | ICD-10-CM | POA: Diagnosis not present

## 2020-03-22 DIAGNOSIS — I10 Essential (primary) hypertension: Secondary | ICD-10-CM | POA: Diagnosis not present

## 2020-03-22 DIAGNOSIS — Z7901 Long term (current) use of anticoagulants: Secondary | ICD-10-CM | POA: Diagnosis not present

## 2020-03-22 DIAGNOSIS — Z7982 Long term (current) use of aspirin: Secondary | ICD-10-CM | POA: Diagnosis not present

## 2020-03-22 DIAGNOSIS — E785 Hyperlipidemia, unspecified: Secondary | ICD-10-CM | POA: Diagnosis not present

## 2020-03-22 DIAGNOSIS — E1151 Type 2 diabetes mellitus with diabetic peripheral angiopathy without gangrene: Secondary | ICD-10-CM | POA: Diagnosis not present

## 2020-03-22 DIAGNOSIS — M86171 Other acute osteomyelitis, right ankle and foot: Secondary | ICD-10-CM | POA: Diagnosis not present

## 2020-03-22 DIAGNOSIS — J9811 Atelectasis: Secondary | ICD-10-CM | POA: Diagnosis not present

## 2020-03-27 DIAGNOSIS — Z7982 Long term (current) use of aspirin: Secondary | ICD-10-CM | POA: Diagnosis not present

## 2020-03-27 DIAGNOSIS — J9811 Atelectasis: Secondary | ICD-10-CM | POA: Diagnosis not present

## 2020-03-27 DIAGNOSIS — Z792 Long term (current) use of antibiotics: Secondary | ICD-10-CM | POA: Diagnosis not present

## 2020-03-27 DIAGNOSIS — M86171 Other acute osteomyelitis, right ankle and foot: Secondary | ICD-10-CM | POA: Diagnosis not present

## 2020-03-27 DIAGNOSIS — M109 Gout, unspecified: Secondary | ICD-10-CM | POA: Diagnosis not present

## 2020-03-27 DIAGNOSIS — E1169 Type 2 diabetes mellitus with other specified complication: Secondary | ICD-10-CM | POA: Diagnosis not present

## 2020-03-27 DIAGNOSIS — Z452 Encounter for adjustment and management of vascular access device: Secondary | ICD-10-CM | POA: Diagnosis not present

## 2020-03-27 DIAGNOSIS — E11621 Type 2 diabetes mellitus with foot ulcer: Secondary | ICD-10-CM | POA: Diagnosis not present

## 2020-03-27 DIAGNOSIS — L97416 Non-pressure chronic ulcer of right heel and midfoot with bone involvement without evidence of necrosis: Secondary | ICD-10-CM | POA: Diagnosis not present

## 2020-03-27 DIAGNOSIS — I10 Essential (primary) hypertension: Secondary | ICD-10-CM | POA: Diagnosis not present

## 2020-03-27 DIAGNOSIS — Z7901 Long term (current) use of anticoagulants: Secondary | ICD-10-CM | POA: Diagnosis not present

## 2020-03-27 DIAGNOSIS — E1151 Type 2 diabetes mellitus with diabetic peripheral angiopathy without gangrene: Secondary | ICD-10-CM | POA: Diagnosis not present

## 2020-03-27 DIAGNOSIS — Z8673 Personal history of transient ischemic attack (TIA), and cerebral infarction without residual deficits: Secondary | ICD-10-CM | POA: Diagnosis not present

## 2020-03-27 DIAGNOSIS — E785 Hyperlipidemia, unspecified: Secondary | ICD-10-CM | POA: Diagnosis not present

## 2020-03-27 DIAGNOSIS — I482 Chronic atrial fibrillation, unspecified: Secondary | ICD-10-CM | POA: Diagnosis not present

## 2020-03-28 ENCOUNTER — Other Ambulatory Visit: Payer: Self-pay

## 2020-03-28 ENCOUNTER — Encounter: Payer: Self-pay | Admitting: Sports Medicine

## 2020-03-28 ENCOUNTER — Ambulatory Visit: Payer: Medicare Other | Admitting: Sports Medicine

## 2020-03-28 DIAGNOSIS — M109 Gout, unspecified: Secondary | ICD-10-CM

## 2020-03-28 DIAGNOSIS — Z7901 Long term (current) use of anticoagulants: Secondary | ICD-10-CM | POA: Diagnosis not present

## 2020-03-28 DIAGNOSIS — M792 Neuralgia and neuritis, unspecified: Secondary | ICD-10-CM

## 2020-03-28 DIAGNOSIS — L97411 Non-pressure chronic ulcer of right heel and midfoot limited to breakdown of skin: Secondary | ICD-10-CM | POA: Diagnosis not present

## 2020-03-28 DIAGNOSIS — E1142 Type 2 diabetes mellitus with diabetic polyneuropathy: Secondary | ICD-10-CM

## 2020-03-28 MED ORDER — METHYLPREDNISOLONE 4 MG PO TBPK
ORAL_TABLET | ORAL | 0 refills | Status: DC
Start: 2020-03-28 — End: 2020-05-10

## 2020-03-28 NOTE — Progress Notes (Signed)
Subjective: Chad Burgess is a 79 y.o. male patient seen in office for evaluation of ulceration of the right heel and ankle pain.  Wife reports that the right heel is good but now ankle is swollen and red and is hurting. Admits to history of gout.   Patient Active Problem List   Diagnosis Date Noted  . Open wound of heel, right, initial encounter 11/15/2019  . Diabetic foot ulcer ([place]) 07/13/2017  . Diabetes mellitus ([place]) 07/13/2017  . HTN (hypertension) 07/13/2017  . Dyslipidemia 07/13/2017   Current Outpatient Medications on File Prior to Visit  Medication Sig Dispense Refill  . allopurinol (ZYLOPRIM) 300 MG tablet Take 1 tablet by mouth daily.    . cloNIDine (CATAPRES) 0.2 MG tablet TAKE 1 TABLET (0.2mg ) BY MOUTH EVERYDAY AT BEDTIME  4  . colchicine 0.6 MG tablet Take 1 tablet (0.6 mg total) by mouth daily. During gout flare up 90 tablet 0  . collagenase (SANTYL) ointment Apply 1 application topically daily. Right heel wound measures 2 x 3cm. 30 g 5  . digoxin (LANOXIN) 0.25 MG tablet Take 250 mcg by mouth daily.  4  . diltiazem (DILT-XR) 120 MG 24 hr capsule Take 1 capsule by mouth daily.    . ertapenem (INVANZ) IVPB Inject 1 g into the vein. Daily through PICC line    . febuxostat (ULORIC) 40 MG tablet Take 40 mg by mouth daily.    . fenofibrate 160 MG tablet Take 160 mg by mouth daily.  4  . Fluticasone-Umeclidin-Vilant (TRELEGY ELLIPTA) 100-62.5-25 MCG/INH AEPB INHALE 1 PUFF BY MOUTH DAILY    . lisinopril (PRINIVIL,ZESTRIL) 40 MG tablet Take 40 mg by mouth 2 (two) times daily.  4  . metFORMIN (GLUCOPHAGE) 1000 MG tablet Take 1,000 mg by mouth 2 (two) times daily.  1  . metoprolol tartrate (LOPRESSOR) 100 MG tablet Take 1 tablet (100mg ) by mouth once daily.  1  . spironolactone (ALDACTONE) 25 MG tablet Take 25 mg by mouth daily.    . traMADol (ULTRAM) 50 MG tablet Take 1 tablet by mouth every 8 (eight) hours as needed. (Patient not taking: Reported on 12/14/2019)    .  triamterene-hydrochlorothiazide (DYAZIDE) 37.5-25 MG capsule Take 1 capsule by mouth daily.   1  . warfarin (COUMADIN) 10 MG tablet Take 10mg  by mouth four times weekly or as directed.  2   No current facility-administered medications on file prior to visit.   No Known Allergies  No results found for this or any previous visit (from the past 2160 hour(s)).  Objective: There were no vitals filed for this visit.  General: Patient is awake, alert, oriented x 3 and in no acute distress.  Dermatology: Skin is warm and dry bilateral with a no open wound to right heel, previous ulcer healed, no erythema, no edema. No acute signs of infection at heel but at ankle there is warmth redness and swelling likely consistent with clinical gout.   Vascular: Dorsalis Pedis pulse = 1/4 Bilateral,  Posterior Tibial pulse = 1/4 Bilateral,  Capillary Fill Time < 5 seconds  Neurologic: Protective sensation absent bilateral.    Musculosketal: + Pain to right ankle.  No results for input(s): GRAMSTAIN, LABORGA in the last 8760 hours.  Assessment and Plan:  Problem List Items Addressed This Visit      Endocrine   Diabetes mellitus ([place])    Other Visit Diagnoses    Skin ulcer of right heel, limited to breakdown of skin ([place])    -  Primary   Healed   Long term current use of anticoagulant       Neuritis       Gout, arthritis       Relevant Medications   methylPREDNISolone (MEDROL DOSEPAK) 4 MG TBPK tablet      -Examined patient  -Right heel wound is healed -Rx Medrol dose pak for suspected gout -Dispensed compression sleeve to wear during the day for edema control.  -Applied offloading pad to shoe and advised wife to continue with the same -Patient to return to office if gout is no better after 1 week and evaluation or sooner if problems arise.  Landis Martins, DPM

## 2020-03-29 ENCOUNTER — Telehealth: Payer: Self-pay | Admitting: *Deleted

## 2020-03-29 DIAGNOSIS — R011 Cardiac murmur, unspecified: Secondary | ICD-10-CM | POA: Diagnosis not present

## 2020-03-29 DIAGNOSIS — E785 Hyperlipidemia, unspecified: Secondary | ICD-10-CM | POA: Diagnosis not present

## 2020-03-29 DIAGNOSIS — M86171 Other acute osteomyelitis, right ankle and foot: Secondary | ICD-10-CM | POA: Diagnosis not present

## 2020-03-29 DIAGNOSIS — Z7982 Long term (current) use of aspirin: Secondary | ICD-10-CM | POA: Diagnosis not present

## 2020-03-29 DIAGNOSIS — I361 Nonrheumatic tricuspid (valve) insufficiency: Secondary | ICD-10-CM | POA: Diagnosis not present

## 2020-03-29 DIAGNOSIS — L97416 Non-pressure chronic ulcer of right heel and midfoot with bone involvement without evidence of necrosis: Secondary | ICD-10-CM | POA: Diagnosis not present

## 2020-03-29 DIAGNOSIS — J9811 Atelectasis: Secondary | ICD-10-CM | POA: Diagnosis not present

## 2020-03-29 DIAGNOSIS — Z452 Encounter for adjustment and management of vascular access device: Secondary | ICD-10-CM | POA: Diagnosis not present

## 2020-03-29 DIAGNOSIS — E1169 Type 2 diabetes mellitus with other specified complication: Secondary | ICD-10-CM | POA: Diagnosis not present

## 2020-03-29 DIAGNOSIS — Z792 Long term (current) use of antibiotics: Secondary | ICD-10-CM | POA: Diagnosis not present

## 2020-03-29 DIAGNOSIS — Z8673 Personal history of transient ischemic attack (TIA), and cerebral infarction without residual deficits: Secondary | ICD-10-CM | POA: Diagnosis not present

## 2020-03-29 DIAGNOSIS — M109 Gout, unspecified: Secondary | ICD-10-CM | POA: Diagnosis not present

## 2020-03-29 DIAGNOSIS — E1151 Type 2 diabetes mellitus with diabetic peripheral angiopathy without gangrene: Secondary | ICD-10-CM | POA: Diagnosis not present

## 2020-03-29 DIAGNOSIS — I10 Essential (primary) hypertension: Secondary | ICD-10-CM | POA: Diagnosis not present

## 2020-03-29 DIAGNOSIS — E11621 Type 2 diabetes mellitus with foot ulcer: Secondary | ICD-10-CM | POA: Diagnosis not present

## 2020-03-29 DIAGNOSIS — I340 Nonrheumatic mitral (valve) insufficiency: Secondary | ICD-10-CM | POA: Diagnosis not present

## 2020-03-29 DIAGNOSIS — Z7901 Long term (current) use of anticoagulants: Secondary | ICD-10-CM | POA: Diagnosis not present

## 2020-03-29 DIAGNOSIS — I482 Chronic atrial fibrillation, unspecified: Secondary | ICD-10-CM | POA: Diagnosis not present

## 2020-03-29 DIAGNOSIS — I350 Nonrheumatic aortic (valve) stenosis: Secondary | ICD-10-CM | POA: Diagnosis not present

## 2020-03-29 NOTE — Telephone Encounter (Signed)
Called and left a message for [facility]) and relayed the message per Dr Cannon Kettle that the patient did not have to wrap heel back up any more. Lattie Haw

## 2020-04-03 DIAGNOSIS — J9811 Atelectasis: Secondary | ICD-10-CM | POA: Diagnosis not present

## 2020-04-03 DIAGNOSIS — Z7901 Long term (current) use of anticoagulants: Secondary | ICD-10-CM | POA: Diagnosis not present

## 2020-04-03 DIAGNOSIS — E1151 Type 2 diabetes mellitus with diabetic peripheral angiopathy without gangrene: Secondary | ICD-10-CM | POA: Diagnosis not present

## 2020-04-03 DIAGNOSIS — Z452 Encounter for adjustment and management of vascular access device: Secondary | ICD-10-CM | POA: Diagnosis not present

## 2020-04-03 DIAGNOSIS — Z792 Long term (current) use of antibiotics: Secondary | ICD-10-CM | POA: Diagnosis not present

## 2020-04-03 DIAGNOSIS — Z8673 Personal history of transient ischemic attack (TIA), and cerebral infarction without residual deficits: Secondary | ICD-10-CM | POA: Diagnosis not present

## 2020-04-03 DIAGNOSIS — M86171 Other acute osteomyelitis, right ankle and foot: Secondary | ICD-10-CM | POA: Diagnosis not present

## 2020-04-03 DIAGNOSIS — I10 Essential (primary) hypertension: Secondary | ICD-10-CM | POA: Diagnosis not present

## 2020-04-03 DIAGNOSIS — L97416 Non-pressure chronic ulcer of right heel and midfoot with bone involvement without evidence of necrosis: Secondary | ICD-10-CM | POA: Diagnosis not present

## 2020-04-03 DIAGNOSIS — I482 Chronic atrial fibrillation, unspecified: Secondary | ICD-10-CM | POA: Diagnosis not present

## 2020-04-03 DIAGNOSIS — E785 Hyperlipidemia, unspecified: Secondary | ICD-10-CM | POA: Diagnosis not present

## 2020-04-03 DIAGNOSIS — M109 Gout, unspecified: Secondary | ICD-10-CM | POA: Diagnosis not present

## 2020-04-03 DIAGNOSIS — E11621 Type 2 diabetes mellitus with foot ulcer: Secondary | ICD-10-CM | POA: Diagnosis not present

## 2020-04-03 DIAGNOSIS — E1169 Type 2 diabetes mellitus with other specified complication: Secondary | ICD-10-CM | POA: Diagnosis not present

## 2020-04-03 DIAGNOSIS — Z7982 Long term (current) use of aspirin: Secondary | ICD-10-CM | POA: Diagnosis not present

## 2020-04-09 DIAGNOSIS — M6281 Muscle weakness (generalized): Secondary | ICD-10-CM | POA: Diagnosis not present

## 2020-04-09 DIAGNOSIS — I700 Atherosclerosis of aorta: Secondary | ICD-10-CM | POA: Diagnosis not present

## 2020-04-09 DIAGNOSIS — R011 Cardiac murmur, unspecified: Secondary | ICD-10-CM | POA: Diagnosis not present

## 2020-04-09 DIAGNOSIS — R2689 Other abnormalities of gait and mobility: Secondary | ICD-10-CM | POA: Diagnosis not present

## 2020-04-09 DIAGNOSIS — M25561 Pain in right knee: Secondary | ICD-10-CM | POA: Diagnosis not present

## 2020-04-09 DIAGNOSIS — I482 Chronic atrial fibrillation, unspecified: Secondary | ICD-10-CM | POA: Diagnosis not present

## 2020-04-09 DIAGNOSIS — M25571 Pain in right ankle and joints of right foot: Secondary | ICD-10-CM | POA: Diagnosis not present

## 2020-04-09 DIAGNOSIS — E1159 Type 2 diabetes mellitus with other circulatory complications: Secondary | ICD-10-CM | POA: Diagnosis not present

## 2020-04-10 DIAGNOSIS — M19041 Primary osteoarthritis, right hand: Secondary | ICD-10-CM | POA: Diagnosis not present

## 2020-04-10 DIAGNOSIS — S63501A Unspecified sprain of right wrist, initial encounter: Secondary | ICD-10-CM | POA: Diagnosis not present

## 2020-04-10 DIAGNOSIS — M25531 Pain in right wrist: Secondary | ICD-10-CM | POA: Diagnosis not present

## 2020-04-16 DIAGNOSIS — R2689 Other abnormalities of gait and mobility: Secondary | ICD-10-CM | POA: Diagnosis not present

## 2020-04-16 DIAGNOSIS — M25571 Pain in right ankle and joints of right foot: Secondary | ICD-10-CM | POA: Diagnosis not present

## 2020-04-16 DIAGNOSIS — M6281 Muscle weakness (generalized): Secondary | ICD-10-CM | POA: Diagnosis not present

## 2020-04-16 DIAGNOSIS — M25561 Pain in right knee: Secondary | ICD-10-CM | POA: Diagnosis not present

## 2020-04-19 DIAGNOSIS — M25561 Pain in right knee: Secondary | ICD-10-CM | POA: Diagnosis not present

## 2020-04-19 DIAGNOSIS — M25571 Pain in right ankle and joints of right foot: Secondary | ICD-10-CM | POA: Diagnosis not present

## 2020-04-19 DIAGNOSIS — M7989 Other specified soft tissue disorders: Secondary | ICD-10-CM | POA: Diagnosis not present

## 2020-04-19 DIAGNOSIS — M25531 Pain in right wrist: Secondary | ICD-10-CM | POA: Diagnosis not present

## 2020-04-19 DIAGNOSIS — M6281 Muscle weakness (generalized): Secondary | ICD-10-CM | POA: Diagnosis not present

## 2020-04-19 DIAGNOSIS — R2689 Other abnormalities of gait and mobility: Secondary | ICD-10-CM | POA: Diagnosis not present

## 2020-04-20 DIAGNOSIS — M65831 Other synovitis and tenosynovitis, right forearm: Secondary | ICD-10-CM | POA: Diagnosis not present

## 2020-04-23 DIAGNOSIS — I350 Nonrheumatic aortic (valve) stenosis: Secondary | ICD-10-CM | POA: Diagnosis not present

## 2020-04-23 DIAGNOSIS — Z7901 Long term (current) use of anticoagulants: Secondary | ICD-10-CM | POA: Diagnosis not present

## 2020-04-23 DIAGNOSIS — I482 Chronic atrial fibrillation, unspecified: Secondary | ICD-10-CM | POA: Diagnosis not present

## 2020-04-23 DIAGNOSIS — Z5181 Encounter for therapeutic drug level monitoring: Secondary | ICD-10-CM | POA: Diagnosis not present

## 2020-04-24 DIAGNOSIS — R2689 Other abnormalities of gait and mobility: Secondary | ICD-10-CM | POA: Diagnosis not present

## 2020-04-24 DIAGNOSIS — M6281 Muscle weakness (generalized): Secondary | ICD-10-CM | POA: Diagnosis not present

## 2020-04-24 DIAGNOSIS — M25571 Pain in right ankle and joints of right foot: Secondary | ICD-10-CM | POA: Diagnosis not present

## 2020-04-24 DIAGNOSIS — M25561 Pain in right knee: Secondary | ICD-10-CM | POA: Diagnosis not present

## 2020-04-25 DIAGNOSIS — C44329 Squamous cell carcinoma of skin of other parts of face: Secondary | ICD-10-CM | POA: Diagnosis not present

## 2020-04-25 DIAGNOSIS — Z5181 Encounter for therapeutic drug level monitoring: Secondary | ICD-10-CM | POA: Diagnosis not present

## 2020-04-25 DIAGNOSIS — L570 Actinic keratosis: Secondary | ICD-10-CM | POA: Diagnosis not present

## 2020-04-25 DIAGNOSIS — Z7901 Long term (current) use of anticoagulants: Secondary | ICD-10-CM | POA: Diagnosis not present

## 2020-04-25 DIAGNOSIS — I482 Chronic atrial fibrillation, unspecified: Secondary | ICD-10-CM | POA: Diagnosis not present

## 2020-04-27 DIAGNOSIS — M6281 Muscle weakness (generalized): Secondary | ICD-10-CM | POA: Diagnosis not present

## 2020-04-27 DIAGNOSIS — M25561 Pain in right knee: Secondary | ICD-10-CM | POA: Diagnosis not present

## 2020-04-27 DIAGNOSIS — M25571 Pain in right ankle and joints of right foot: Secondary | ICD-10-CM | POA: Diagnosis not present

## 2020-04-27 DIAGNOSIS — R2689 Other abnormalities of gait and mobility: Secondary | ICD-10-CM | POA: Diagnosis not present

## 2020-05-09 DIAGNOSIS — E1159 Type 2 diabetes mellitus with other circulatory complications: Secondary | ICD-10-CM | POA: Diagnosis not present

## 2020-05-09 DIAGNOSIS — I482 Chronic atrial fibrillation, unspecified: Secondary | ICD-10-CM | POA: Diagnosis not present

## 2020-05-09 DIAGNOSIS — I152 Hypertension secondary to endocrine disorders: Secondary | ICD-10-CM | POA: Diagnosis not present

## 2020-05-09 DIAGNOSIS — I350 Nonrheumatic aortic (valve) stenosis: Secondary | ICD-10-CM | POA: Diagnosis not present

## 2020-05-09 DIAGNOSIS — Z5181 Encounter for therapeutic drug level monitoring: Secondary | ICD-10-CM | POA: Diagnosis not present

## 2020-05-09 DIAGNOSIS — Z7901 Long term (current) use of anticoagulants: Secondary | ICD-10-CM | POA: Diagnosis not present

## 2020-05-10 ENCOUNTER — Encounter: Payer: Self-pay | Admitting: Cardiology

## 2020-05-10 ENCOUNTER — Encounter: Payer: Self-pay | Admitting: *Deleted

## 2020-05-10 DIAGNOSIS — I4891 Unspecified atrial fibrillation: Secondary | ICD-10-CM | POA: Insufficient documentation

## 2020-05-10 DIAGNOSIS — E119 Type 2 diabetes mellitus without complications: Secondary | ICD-10-CM | POA: Insufficient documentation

## 2020-05-10 DIAGNOSIS — E7800 Pure hypercholesterolemia, unspecified: Secondary | ICD-10-CM | POA: Insufficient documentation

## 2020-05-10 DIAGNOSIS — M1990 Unspecified osteoarthritis, unspecified site: Secondary | ICD-10-CM | POA: Insufficient documentation

## 2020-05-10 DIAGNOSIS — Z8739 Personal history of other diseases of the musculoskeletal system and connective tissue: Secondary | ICD-10-CM | POA: Insufficient documentation

## 2020-05-10 DIAGNOSIS — G510 Bell's palsy: Secondary | ICD-10-CM | POA: Insufficient documentation

## 2020-05-10 DIAGNOSIS — I10 Essential (primary) hypertension: Secondary | ICD-10-CM | POA: Insufficient documentation

## 2020-05-10 DIAGNOSIS — C4490 Unspecified malignant neoplasm of skin, unspecified: Secondary | ICD-10-CM | POA: Insufficient documentation

## 2020-05-11 DIAGNOSIS — I350 Nonrheumatic aortic (valve) stenosis: Secondary | ICD-10-CM | POA: Insufficient documentation

## 2020-05-11 DIAGNOSIS — M65831 Other synovitis and tenosynovitis, right forearm: Secondary | ICD-10-CM | POA: Diagnosis not present

## 2020-05-11 DIAGNOSIS — J449 Chronic obstructive pulmonary disease, unspecified: Secondary | ICD-10-CM | POA: Insufficient documentation

## 2020-05-11 DIAGNOSIS — I700 Atherosclerosis of aorta: Secondary | ICD-10-CM | POA: Insufficient documentation

## 2020-05-11 DIAGNOSIS — E1151 Type 2 diabetes mellitus with diabetic peripheral angiopathy without gangrene: Secondary | ICD-10-CM | POA: Insufficient documentation

## 2020-05-14 ENCOUNTER — Encounter: Payer: Self-pay | Admitting: Cardiology

## 2020-05-14 ENCOUNTER — Ambulatory Visit: Payer: Medicare Other | Admitting: Cardiology

## 2020-05-14 ENCOUNTER — Other Ambulatory Visit: Payer: Self-pay

## 2020-05-14 VITALS — BP 122/80 | HR 87 | Ht 74.0 in | Wt 210.0 lb

## 2020-05-14 DIAGNOSIS — L97529 Non-pressure chronic ulcer of other part of left foot with unspecified severity: Secondary | ICD-10-CM

## 2020-05-14 DIAGNOSIS — E785 Hyperlipidemia, unspecified: Secondary | ICD-10-CM | POA: Diagnosis not present

## 2020-05-14 DIAGNOSIS — I350 Nonrheumatic aortic (valve) stenosis: Secondary | ICD-10-CM | POA: Diagnosis not present

## 2020-05-14 DIAGNOSIS — E1151 Type 2 diabetes mellitus with diabetic peripheral angiopathy without gangrene: Secondary | ICD-10-CM

## 2020-05-14 DIAGNOSIS — E11621 Type 2 diabetes mellitus with foot ulcer: Secondary | ICD-10-CM | POA: Diagnosis not present

## 2020-05-14 DIAGNOSIS — I10 Essential (primary) hypertension: Secondary | ICD-10-CM

## 2020-05-14 NOTE — Patient Instructions (Signed)
Medication Instructions:  Your physician recommends that you continue on your current medications as directed. Please refer to the Current Medication list given to you today.  *If you need a refill on your cardiac medications before your next appointment, please call your pharmacy*   Lab Work: None. If you have labs (blood work) drawn today and your tests are completely normal, you will receive your results only by: Marland Kitchen MyChart Message (if you have MyChart) OR . A paper copy in the mail If you have any lab test that is abnormal or we need to change your treatment, we will call you to review the results.   Testing/Procedures: Your physician has requested that you have an echocardiogram. Echocardiography is a painless test that uses sound waves to create images of your heart. It provides your doctor with information about the size and shape of your heart and how well your heart's chambers and valves are working. This procedure takes approximately one hour. There are no restrictions for this procedure.     Follow-Up: At [facility], you and your health needs are our priority.  As part of our continuing mission to provide you with exceptional heart care, we have created designated Provider Care Teams.  These Care Teams include your primary Cardiologist (physician) and Advanced Practice Providers (APPs -  Physician Assistants and Nurse Practitioners) who all work together to provide you with the care you need, when you need it.  We recommend signing up for the patient portal called "MyChart".  Sign up information is provided on this After Visit Summary.  MyChart is used to connect with patients for Virtual Visits (Telemedicine).  Patients are able to view lab/test results, encounter notes, upcoming appointments, etc.  Non-urgent messages can be sent to your provider as well.   To learn more about what you can do with MyChart, go to [email or web address].    Your next appointment:   3  month(s)  The format for your next appointment:   In Person  Provider:   Jenne Campus, MD   Other Instructions   Echocardiogram An echocardiogram is a procedure that uses painless sound waves (ultrasound) to produce an image of the heart. Images from an echocardiogram can provide important information about:  Signs of coronary artery disease (CAD).  Aneurysm detection. An aneurysm is a weak or damaged part of an artery wall that bulges out from the normal force of blood pumping through the body.  Heart size and shape. Changes in the size or shape of the heart can be associated with certain conditions, including heart failure, aneurysm, and CAD.  Heart muscle function.  Heart valve function.  Signs of a past heart attack.  Fluid buildup around the heart.  Thickening of the heart muscle.  A tumor or infectious growth around the heart valves. Tell a health care provider about:  Any allergies you have.  All medicines you are taking, including vitamins, herbs, eye drops, creams, and over-the-counter medicines.  Any blood disorders you have.  Any surgeries you have had.  Any medical conditions you have.  Whether you are pregnant or may be pregnant. What are the risks? Generally, this is a safe procedure. However, problems may occur, including:  Allergic reaction to dye (contrast) that may be used during the procedure. What happens before the procedure? No specific preparation is needed. You may eat and drink normally. What happens during the procedure?   An IV tube may be inserted into one of your veins.  You may receive  contrast through this tube. A contrast is an injection that improves the quality of the pictures from your heart.  A gel will be applied to your chest.  A wand-like tool (transducer) will be moved over your chest. The gel will help to transmit the sound waves from the transducer.  The sound waves will harmlessly bounce off of your heart to  allow the heart images to be captured in real-time motion. The images will be recorded on a computer. The procedure may vary among health care providers and hospitals. What happens after the procedure?  You may return to your normal, everyday life, including diet, activities, and medicines, unless your health care provider tells you not to do that. Summary  An echocardiogram is a procedure that uses painless sound waves (ultrasound) to produce an image of the heart.  Images from an echocardiogram can provide important information about the size and shape of your heart, heart muscle function, heart valve function, and fluid buildup around your heart.  You do not need to do anything to prepare before this procedure. You may eat and drink normally.  After the echocardiogram is completed, you may return to your normal, everyday life, unless your health care provider tells you not to do that. This information is not intended to replace advice given to you by your health care provider. Make sure you discuss any questions you have with your health care provider. Document Revised: 09/16/2018 Document Reviewed: 06/28/2016 Elsevier Patient Education  2020 [employer]   

## 2020-05-14 NOTE — Addendum Note (Signed)
Addended by: Senaida Ores on: 05/14/2020 09:20 AM   Modules accepted: Orders
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
## 2020-05-23 DIAGNOSIS — Z7901 Long term (current) use of anticoagulants: Secondary | ICD-10-CM | POA: Diagnosis not present

## 2020-05-23 DIAGNOSIS — J370 Chronic laryngitis: Secondary | ICD-10-CM | POA: Diagnosis not present

## 2020-05-23 DIAGNOSIS — I482 Chronic atrial fibrillation, unspecified: Secondary | ICD-10-CM | POA: Diagnosis not present

## 2020-05-23 DIAGNOSIS — I350 Nonrheumatic aortic (valve) stenosis: Secondary | ICD-10-CM | POA: Diagnosis not present

## 2020-05-27 DIAGNOSIS — M65831 Other synovitis and tenosynovitis, right forearm: Secondary | ICD-10-CM | POA: Diagnosis not present

## 2020-05-27 DIAGNOSIS — M25441 Effusion, right hand: Secondary | ICD-10-CM | POA: Diagnosis not present

## 2020-05-27 DIAGNOSIS — R601 Generalized edema: Secondary | ICD-10-CM | POA: Diagnosis not present

## 2020-05-27 DIAGNOSIS — M25431 Effusion, right wrist: Secondary | ICD-10-CM | POA: Diagnosis not present

## 2020-06-06 DIAGNOSIS — M19039 Primary osteoarthritis, unspecified wrist: Secondary | ICD-10-CM | POA: Diagnosis not present

## 2020-06-09 DIAGNOSIS — R011 Cardiac murmur, unspecified: Secondary | ICD-10-CM | POA: Diagnosis not present

## 2020-06-09 DIAGNOSIS — I482 Chronic atrial fibrillation, unspecified: Secondary | ICD-10-CM | POA: Diagnosis not present

## 2020-06-09 DIAGNOSIS — I152 Hypertension secondary to endocrine disorders: Secondary | ICD-10-CM | POA: Diagnosis not present

## 2020-06-09 DIAGNOSIS — I350 Nonrheumatic aortic (valve) stenosis: Secondary | ICD-10-CM | POA: Diagnosis not present

## 2020-06-11 ENCOUNTER — Other Ambulatory Visit: Payer: Medicare Other

## 2020-06-14 ENCOUNTER — Other Ambulatory Visit: Payer: Self-pay

## 2020-06-14 ENCOUNTER — Ambulatory Visit (INDEPENDENT_AMBULATORY_CARE_PROVIDER_SITE_OTHER): Payer: Medicare Other

## 2020-06-14 DIAGNOSIS — I350 Nonrheumatic aortic (valve) stenosis: Secondary | ICD-10-CM

## 2020-06-14 LAB — ECHOCARDIOGRAM COMPLETE
AR max vel: 0.44 cm2
AV Area VTI: 0.54 cm2
AV Area mean vel: 0.44 cm2
AV Mean grad: 34 mmHg
AV Peak grad: 63.5 mmHg
Ao pk vel: 3.99 m/s
S' Lateral: 3.5 cm

## 2020-06-14 NOTE — Progress Notes (Signed)
Complete echocardiogram performed.  Jimmy Raymie Giammarco RDCS, RVT  

## 2020-06-15 ENCOUNTER — Telehealth: Payer: Self-pay

## 2020-06-15 NOTE — Telephone Encounter (Signed)
-----   Message from Park Liter, MD sent at 06/15/2020  8:23 AM EST ----- Echocardiogram showed preserved left ventricle ejection fraction, there appears to be significant severe low-flow aortic stenosis.  Please schedule him to have follow-up with me within a month

## 2020-06-15 NOTE — Telephone Encounter (Signed)
Spoke with the patient wife and advised the following per Dr. Agustin Cree. Patient is schedule on 07/23/2020.

## 2020-06-20 DIAGNOSIS — J370 Chronic laryngitis: Secondary | ICD-10-CM | POA: Diagnosis not present

## 2020-06-20 DIAGNOSIS — I482 Chronic atrial fibrillation, unspecified: Secondary | ICD-10-CM | POA: Diagnosis not present

## 2020-06-20 DIAGNOSIS — Z7901 Long term (current) use of anticoagulants: Secondary | ICD-10-CM | POA: Diagnosis not present

## 2020-06-20 DIAGNOSIS — Z5181 Encounter for therapeutic drug level monitoring: Secondary | ICD-10-CM | POA: Diagnosis not present

## 2020-07-06 DIAGNOSIS — M19039 Primary osteoarthritis, unspecified wrist: Secondary | ICD-10-CM | POA: Diagnosis not present

## 2020-07-06 DIAGNOSIS — M659 Synovitis and tenosynovitis, unspecified: Secondary | ICD-10-CM | POA: Diagnosis not present

## 2020-07-10 DIAGNOSIS — I152 Hypertension secondary to endocrine disorders: Secondary | ICD-10-CM | POA: Diagnosis not present

## 2020-07-10 DIAGNOSIS — R011 Cardiac murmur, unspecified: Secondary | ICD-10-CM | POA: Diagnosis not present

## 2020-07-10 DIAGNOSIS — I482 Chronic atrial fibrillation, unspecified: Secondary | ICD-10-CM | POA: Diagnosis not present

## 2020-07-18 DIAGNOSIS — E1169 Type 2 diabetes mellitus with other specified complication: Secondary | ICD-10-CM | POA: Diagnosis not present

## 2020-07-20 DIAGNOSIS — M79604 Pain in right leg: Secondary | ICD-10-CM | POA: Diagnosis not present

## 2020-07-20 DIAGNOSIS — M1711 Unilateral primary osteoarthritis, right knee: Secondary | ICD-10-CM | POA: Diagnosis not present

## 2020-07-20 DIAGNOSIS — M65831 Other synovitis and tenosynovitis, right forearm: Secondary | ICD-10-CM | POA: Diagnosis not present

## 2020-07-24 ENCOUNTER — Other Ambulatory Visit: Payer: Self-pay

## 2020-07-24 ENCOUNTER — Encounter: Payer: Self-pay | Admitting: Cardiology

## 2020-07-24 ENCOUNTER — Ambulatory Visit: Payer: Medicare Other | Admitting: Cardiology

## 2020-07-24 VITALS — BP 136/64 | HR 61 | Ht 75.0 in | Wt 212.0 lb

## 2020-07-24 DIAGNOSIS — I10 Essential (primary) hypertension: Secondary | ICD-10-CM

## 2020-07-24 DIAGNOSIS — I350 Nonrheumatic aortic (valve) stenosis: Secondary | ICD-10-CM

## 2020-07-24 DIAGNOSIS — I4821 Permanent atrial fibrillation: Secondary | ICD-10-CM | POA: Diagnosis not present

## 2020-07-24 DIAGNOSIS — E1151 Type 2 diabetes mellitus with diabetic peripheral angiopathy without gangrene: Secondary | ICD-10-CM | POA: Diagnosis not present

## 2020-07-24 NOTE — Patient Instructions (Signed)
Medication Instructions:  Your physician recommends that you continue on your current medications as directed. Please refer to the Current Medication list given to you today.  *If you need a refill on your cardiac medications before your next appointment, please call your pharmacy*   Lab Work: None If you have labs (blood work) drawn today and your tests are completely normal, you will receive your results only by: MyChart Message (if you have MyChart) OR A paper copy in the mail If you have any lab test that is abnormal or we need to change your treatment, we will call you to review the results.   Testing/Procedures: None   Follow-Up: At [facility], you and your health needs are our priority.  As part of our continuing mission to provide you with exceptional heart care, we have created designated Provider Care Teams.  These Care Teams include your primary Cardiologist (physician) and Advanced Practice Providers (APPs -  Physician Assistants and Nurse Practitioners) who all work together to provide you with the care you need, when you need it.  We recommend signing up for the patient portal called "MyChart".  Sign up information is provided on this After Visit Summary.  MyChart is used to connect with patients for Virtual Visits (Telemedicine).  Patients are able to view lab/test results, encounter notes, upcoming appointments, etc.  Non-urgent messages can be sent to your provider as well.   To learn more about what you can do with MyChart, go to [email or web address].    Your next appointment:   1 month(s)  The format for your next appointment:   In Person  Provider:   Robert Krasowski, MD   Other Instructions   
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
## 2020-07-24 NOTE — Addendum Note (Signed)
Addended by: Senaida Ores on: 07/24/2020 03:43 PM   Modules accepted: Orders

## 2020-07-25 DIAGNOSIS — I10 Essential (primary) hypertension: Secondary | ICD-10-CM | POA: Diagnosis not present

## 2020-07-25 DIAGNOSIS — E785 Hyperlipidemia, unspecified: Secondary | ICD-10-CM | POA: Diagnosis not present

## 2020-07-25 DIAGNOSIS — E1169 Type 2 diabetes mellitus with other specified complication: Secondary | ICD-10-CM | POA: Diagnosis not present

## 2020-07-25 DIAGNOSIS — I482 Chronic atrial fibrillation, unspecified: Secondary | ICD-10-CM | POA: Diagnosis not present

## 2020-08-01 ENCOUNTER — Telehealth: Payer: Self-pay | Admitting: Cardiology

## 2020-08-01 NOTE — Telephone Encounter (Signed)
Chad Burgess is calling wanting to know if Chad Burgess upcoming appointment can be a virtual visit or have permission for more than one visitor at his upcoming appointment. Please advise.

## 2020-08-03 NOTE — Telephone Encounter (Signed)
Called patient informed him that he can only have one person at next visit.

## 2020-08-03 NOTE — Telephone Encounter (Signed)
He can come with somebody with one visitor to our incoming visit

## 2020-08-06 DIAGNOSIS — E1169 Type 2 diabetes mellitus with other specified complication: Secondary | ICD-10-CM | POA: Diagnosis not present

## 2020-08-06 DIAGNOSIS — I499 Cardiac arrhythmia, unspecified: Secondary | ICD-10-CM | POA: Diagnosis not present

## 2020-08-06 DIAGNOSIS — I4891 Unspecified atrial fibrillation: Secondary | ICD-10-CM | POA: Diagnosis not present

## 2020-08-06 DIAGNOSIS — B958 Unspecified staphylococcus as the cause of diseases classified elsewhere: Secondary | ICD-10-CM | POA: Diagnosis not present

## 2020-08-06 DIAGNOSIS — E785 Hyperlipidemia, unspecified: Secondary | ICD-10-CM | POA: Diagnosis not present

## 2020-08-06 DIAGNOSIS — I959 Hypotension, unspecified: Secondary | ICD-10-CM | POA: Diagnosis not present

## 2020-08-06 DIAGNOSIS — I10 Essential (primary) hypertension: Secondary | ICD-10-CM | POA: Diagnosis not present

## 2020-08-06 DIAGNOSIS — R410 Disorientation, unspecified: Secondary | ICD-10-CM | POA: Diagnosis not present

## 2020-08-06 DIAGNOSIS — I110 Hypertensive heart disease with heart failure: Secondary | ICD-10-CM | POA: Diagnosis not present

## 2020-08-06 DIAGNOSIS — G319 Degenerative disease of nervous system, unspecified: Secondary | ICD-10-CM | POA: Diagnosis not present

## 2020-08-06 DIAGNOSIS — R279 Unspecified lack of coordination: Secondary | ICD-10-CM | POA: Diagnosis not present

## 2020-08-06 DIAGNOSIS — R918 Other nonspecific abnormal finding of lung field: Secondary | ICD-10-CM | POA: Diagnosis not present

## 2020-08-06 DIAGNOSIS — R6889 Other general symptoms and signs: Secondary | ICD-10-CM | POA: Diagnosis not present

## 2020-08-06 DIAGNOSIS — S91302A Unspecified open wound, left foot, initial encounter: Secondary | ICD-10-CM | POA: Diagnosis not present

## 2020-08-06 DIAGNOSIS — G9341 Metabolic encephalopathy: Secondary | ICD-10-CM | POA: Diagnosis not present

## 2020-08-06 DIAGNOSIS — I350 Nonrheumatic aortic (valve) stenosis: Secondary | ICD-10-CM | POA: Diagnosis not present

## 2020-08-06 DIAGNOSIS — Z743 Need for continuous supervision: Secondary | ICD-10-CM | POA: Diagnosis not present

## 2020-08-06 DIAGNOSIS — R490 Dysphonia: Secondary | ICD-10-CM | POA: Diagnosis not present

## 2020-08-06 DIAGNOSIS — J9622 Acute and chronic respiratory failure with hypercapnia: Secondary | ICD-10-CM | POA: Diagnosis not present

## 2020-08-06 DIAGNOSIS — N390 Urinary tract infection, site not specified: Secondary | ICD-10-CM | POA: Diagnosis not present

## 2020-08-06 DIAGNOSIS — R600 Localized edema: Secondary | ICD-10-CM | POA: Diagnosis not present

## 2020-08-06 DIAGNOSIS — R791 Abnormal coagulation profile: Secondary | ICD-10-CM | POA: Diagnosis not present

## 2020-08-06 DIAGNOSIS — S90822A Blister (nonthermal), left foot, initial encounter: Secondary | ICD-10-CM | POA: Diagnosis not present

## 2020-08-06 DIAGNOSIS — I472 Ventricular tachycardia: Secondary | ICD-10-CM | POA: Diagnosis not present

## 2020-08-06 DIAGNOSIS — R1110 Vomiting, unspecified: Secondary | ICD-10-CM | POA: Diagnosis not present

## 2020-08-06 DIAGNOSIS — J690 Pneumonitis due to inhalation of food and vomit: Secondary | ICD-10-CM | POA: Diagnosis not present

## 2020-08-06 DIAGNOSIS — J90 Pleural effusion, not elsewhere classified: Secondary | ICD-10-CM | POA: Diagnosis not present

## 2020-08-06 DIAGNOSIS — R0689 Other abnormalities of breathing: Secondary | ICD-10-CM | POA: Diagnosis not present

## 2020-08-06 DIAGNOSIS — D696 Thrombocytopenia, unspecified: Secondary | ICD-10-CM | POA: Diagnosis not present

## 2020-08-06 DIAGNOSIS — Z79899 Other long term (current) drug therapy: Secondary | ICD-10-CM | POA: Diagnosis not present

## 2020-08-06 DIAGNOSIS — R0989 Other specified symptoms and signs involving the circulatory and respiratory systems: Secondary | ICD-10-CM | POA: Diagnosis not present

## 2020-08-06 DIAGNOSIS — I5033 Acute on chronic diastolic (congestive) heart failure: Secondary | ICD-10-CM | POA: Diagnosis not present

## 2020-08-06 DIAGNOSIS — I517 Cardiomegaly: Secondary | ICD-10-CM | POA: Diagnosis not present

## 2020-08-06 DIAGNOSIS — E11621 Type 2 diabetes mellitus with foot ulcer: Secondary | ICD-10-CM | POA: Diagnosis not present

## 2020-08-06 DIAGNOSIS — R531 Weakness: Secondary | ICD-10-CM | POA: Diagnosis not present

## 2020-08-06 DIAGNOSIS — M109 Gout, unspecified: Secondary | ICD-10-CM | POA: Diagnosis not present

## 2020-08-06 DIAGNOSIS — J341 Cyst and mucocele of nose and nasal sinus: Secondary | ICD-10-CM | POA: Diagnosis not present

## 2020-08-06 DIAGNOSIS — R001 Bradycardia, unspecified: Secondary | ICD-10-CM | POA: Diagnosis not present

## 2020-08-06 DIAGNOSIS — M19072 Primary osteoarthritis, left ankle and foot: Secondary | ICD-10-CM | POA: Diagnosis not present

## 2020-08-06 DIAGNOSIS — J449 Chronic obstructive pulmonary disease, unspecified: Secondary | ICD-10-CM | POA: Diagnosis not present

## 2020-08-06 DIAGNOSIS — M7989 Other specified soft tissue disorders: Secondary | ICD-10-CM | POA: Diagnosis not present

## 2020-08-06 DIAGNOSIS — I6782 Cerebral ischemia: Secondary | ICD-10-CM | POA: Diagnosis not present

## 2020-08-06 DIAGNOSIS — I739 Peripheral vascular disease, unspecified: Secondary | ICD-10-CM | POA: Diagnosis not present

## 2020-08-06 DIAGNOSIS — J9601 Acute respiratory failure with hypoxia: Secondary | ICD-10-CM | POA: Diagnosis not present

## 2020-08-06 DIAGNOSIS — Z7901 Long term (current) use of anticoagulants: Secondary | ICD-10-CM | POA: Diagnosis not present

## 2020-08-06 DIAGNOSIS — I482 Chronic atrial fibrillation, unspecified: Secondary | ICD-10-CM | POA: Diagnosis not present

## 2020-08-06 DIAGNOSIS — R609 Edema, unspecified: Secondary | ICD-10-CM | POA: Diagnosis not present

## 2020-08-06 DIAGNOSIS — I071 Rheumatic tricuspid insufficiency: Secondary | ICD-10-CM | POA: Diagnosis not present

## 2020-08-07 DIAGNOSIS — I071 Rheumatic tricuspid insufficiency: Secondary | ICD-10-CM

## 2020-08-07 DIAGNOSIS — I350 Nonrheumatic aortic (valve) stenosis: Secondary | ICD-10-CM

## 2020-08-07 DIAGNOSIS — I10 Essential (primary) hypertension: Secondary | ICD-10-CM | POA: Diagnosis not present

## 2020-08-07 DIAGNOSIS — R791 Abnormal coagulation profile: Secondary | ICD-10-CM

## 2020-08-07 DIAGNOSIS — E785 Hyperlipidemia, unspecified: Secondary | ICD-10-CM | POA: Diagnosis not present

## 2020-08-07 DIAGNOSIS — R490 Dysphonia: Secondary | ICD-10-CM

## 2020-08-07 DIAGNOSIS — R410 Disorientation, unspecified: Secondary | ICD-10-CM | POA: Diagnosis not present

## 2020-08-07 DIAGNOSIS — I482 Chronic atrial fibrillation, unspecified: Secondary | ICD-10-CM

## 2020-08-07 DIAGNOSIS — E1169 Type 2 diabetes mellitus with other specified complication: Secondary | ICD-10-CM | POA: Diagnosis not present

## 2020-08-08 DIAGNOSIS — I482 Chronic atrial fibrillation, unspecified: Secondary | ICD-10-CM | POA: Diagnosis not present

## 2020-08-08 DIAGNOSIS — I350 Nonrheumatic aortic (valve) stenosis: Secondary | ICD-10-CM | POA: Diagnosis not present

## 2020-08-08 DIAGNOSIS — R791 Abnormal coagulation profile: Secondary | ICD-10-CM | POA: Diagnosis not present

## 2020-08-08 DIAGNOSIS — R490 Dysphonia: Secondary | ICD-10-CM | POA: Diagnosis not present

## 2020-08-12 DIAGNOSIS — S90822A Blister (nonthermal), left foot, initial encounter: Secondary | ICD-10-CM

## 2020-08-14 ENCOUNTER — Ambulatory Visit: Payer: Medicare Other | Admitting: Cardiology

## 2020-08-22 DIAGNOSIS — R799 Abnormal finding of blood chemistry, unspecified: Secondary | ICD-10-CM | POA: Diagnosis not present

## 2020-08-23 ENCOUNTER — Ambulatory Visit: Payer: Medicare Other | Admitting: Cardiology

## 2021-01-07 DEATH — deceased
